# Patient Record
Sex: Male | Born: 1955 | Hispanic: No | Smoking: Never smoker
Health system: Southern US, Community
[De-identification: ages and names within clinical notes are randomized; demographics above are authoritative.]

## PROBLEM LIST (undated history)

## (undated) DIAGNOSIS — E785 Hyperlipidemia, unspecified: Secondary | ICD-10-CM

## (undated) DIAGNOSIS — T7840XA Allergy, unspecified, initial encounter: Secondary | ICD-10-CM

## (undated) HISTORY — PX: KNEE SURGERY: SHX244

## (undated) HISTORY — DX: Allergy, unspecified, initial encounter: T78.40XA

## (undated) HISTORY — DX: Hyperlipidemia, unspecified: E78.5

## (undated) HISTORY — PX: HERNIA REPAIR: SHX51

## (undated) HISTORY — PX: JOINT REPLACEMENT: SHX530

---

## 2008-06-08 ENCOUNTER — Ambulatory Visit: Payer: Self-pay | Admitting: Gastroenterology

## 2008-06-24 ENCOUNTER — Telehealth: Payer: Self-pay | Admitting: Gastroenterology

## 2008-06-30 ENCOUNTER — Ambulatory Visit: Payer: Self-pay | Admitting: Gastroenterology

## 2008-06-30 ENCOUNTER — Encounter: Payer: Self-pay | Admitting: Gastroenterology

## 2008-06-30 HISTORY — PX: COLONOSCOPY: SHX174

## 2008-07-07 ENCOUNTER — Encounter: Payer: Self-pay | Admitting: Gastroenterology

## 2010-10-19 LAB — LIPID PANEL
Cholesterol: 252 mg/dL — AB (ref 0–200)
HDL: 50 mg/dL (ref 35–70)
LDL Cholesterol: 165 mg/dL

## 2011-02-11 ENCOUNTER — Encounter: Payer: Self-pay | Admitting: *Deleted

## 2011-02-11 DIAGNOSIS — E785 Hyperlipidemia, unspecified: Secondary | ICD-10-CM

## 2011-02-13 ENCOUNTER — Ambulatory Visit: Payer: BC Managed Care – PPO

## 2011-02-13 ENCOUNTER — Encounter: Payer: Self-pay | Admitting: Physician Assistant

## 2011-02-13 ENCOUNTER — Ambulatory Visit (INDEPENDENT_AMBULATORY_CARE_PROVIDER_SITE_OTHER): Payer: BC Managed Care – PPO | Admitting: Family Medicine

## 2011-02-13 VITALS — BP 136/78 | HR 76 | Temp 98.0°F | Resp 16 | Ht 66.5 in | Wt 180.0 lb

## 2011-02-13 DIAGNOSIS — R079 Chest pain, unspecified: Secondary | ICD-10-CM

## 2011-02-13 DIAGNOSIS — R0781 Pleurodynia: Secondary | ICD-10-CM

## 2011-02-13 DIAGNOSIS — S20219A Contusion of unspecified front wall of thorax, initial encounter: Secondary | ICD-10-CM

## 2011-02-13 LAB — POCT URINALYSIS DIPSTICK
Bilirubin, UA: NEGATIVE
Glucose, UA: NEGATIVE
Ketones, UA: NEGATIVE
Leukocytes, UA: NEGATIVE
Protein, UA: NEGATIVE
Spec Grav, UA: 1.02

## 2011-02-13 LAB — POCT UA - MICROSCOPIC ONLY
Bacteria, U Microscopic: NEGATIVE
Crystals, Ur, HPF, POC: NEGATIVE
RBC, urine, microscopic: NEGATIVE
WBC, Ur, HPF, POC: NEGATIVE

## 2011-02-13 MED ORDER — CYCLOBENZAPRINE HCL 5 MG PO TABS: 5.0000 mg | ORAL_TABLET | Freq: Two times a day (BID) | ORAL | Status: AC | PRN

## 2011-02-13 MED ORDER — MELOXICAM 7.5 MG PO TABS: 7.5000 mg | ORAL_TABLET | Freq: Two times a day (BID) | ORAL | Status: AC | PRN

## 2011-02-13 NOTE — Progress Notes (Signed)
Patient ID: JANICE SEALES MRN: [id number], DOB: November 19, 1955, 56 y.o. Date of Encounter: 02/13/2011, 2:56 PM  Primary Physician: No primary provider on file.  Chief Complaint: Left flank pain  HPI: 56 year old Caucasian male with history below presents with 18 day history of left sided lower rib pain. Patient states he was sledding in the street 19/20 days prior by running and jumping into the sled chest first possibly causing his left elbow to hit his left side along the lower ribs. He did not notice any pain along this area until 1 or 2 days after sledding. He does not have any pain with inspiration or expiration. Only with pain if he moves a certain by rotation or sometimes from going from supine to seated. Movements do not always cause discomfort. Pain is categorized as dull that sometimes becomes sharp if moves a certain way. Unable to provide further details about movement. Never with bruising. No urinary or GI symptoms. Pain does not radiate into the abdomen. Still able to go to the gym. Tried nothing.  Has not yet gotten Pravastatin filled. Trying lifestyle changes at this time. Has the Rx for Pravastatin at home, considering filling it.     Past Medical History  Diagnosis Date  . Hyperlipidemia      Home Meds: Prior to Admission medications   Medication Sig Start Date End Date Taking? Authorizing Provider  b complex vitamins tablet Take 1 tablet by mouth daily.   Yes Historical Provider, MD  Cholecalciferol (D3 ADULT PO) Take by mouth daily.   Yes Historical Provider, MD  pravastatin (PRAVACHOL) 20 MG tablet Take 20 mg by mouth every evening.    Historical Provider, MD    Allergies: No Known Allergies  History   Social History  . Marital Status: Married    Spouse Name: N/A    Number of Children: N/A  . Years of Education: COLLEGE   Occupational History  . SALES    Social History Main Topics  . Smoking status: Never Smoker   . Smokeless tobacco: Not on file  .  Alcohol Use: Yes     3 PER WEEK  . Drug Use: Not on file  . Sexually Active: Not on file   Other Topics Concern  . Not on file   Social History Narrative   EXERCISE TWICE WEEKLY FOR 1 HOUR.     Review of Systems: Constitutional: negative for chills, fever, night sweats, weight changes, or fatigue  HEENT: negative for vision changes, hearing loss, congestion, rhinorrhea, ST, epistaxis, or sinus pressure Cardiovascular: negative for chest pain or palpitations Respiratory: negative for hemoptysis, wheezing, shortness of breath, or cough Abdominal: negative for abdominal pain, nausea, vomiting, diarrhea, or constipation Dermatological: negative for rash Neurologic: negative for headache, dizziness, or syncope All other systems reviewed and are otherwise negative with the exception to those above and in the HPI.   Physical Exam: Blood pressure 136/78, pulse 76, temperature 98 F (36.7 C), temperature source Oral, resp. rate 16, height 5' 6.5" (1.689 m), weight 180 lb (81.647 kg). Body mass index is 28.62 kg/(m^2). General: Well developed, well nourished, in no acute distress. Head: Normocephalic, atraumatic, eyes without discharge, sclera non-icteric, nares are without discharge. Oral cavity moist.  Neck: Supple. No thyromegaly. Full ROM. No lymphadenopathy. Lungs: Clear bilaterally to auscultation without wheezes, rales, or rhonchi. Breathing is unlabored. No TTP elicited during exam today. Reports his tenderness is located along the midaxillary line along ribs 11 and 12. No wounds or  ecchymosis. Normal breath sounds over this area. Has FROM of left arm without discomfort. Able to go from seated to supine to seated again without difficulty.  Heart: RRR with S1 S2. No murmurs, rubs, or gallops appreciated. Abdomen: Soft, non-tender, non-distended with normoactive bowel sounds. No hepatomegaly. No rebound/guarding. No obvious abdominal masses.  Msk:  Strength and tone normal for  age. Extremities/Skin: Warm and dry. No clubbing or cyanosis. No edema. No rashes or suspicious lesions. Neuro: Alert and oriented X 3. Moves all extremities spontaneously. Gait is normal. CNII-XII grossly in tact. Psych:  Responds to questions appropriately with a normal affect.   Labs: Declines CBC Studies: [facility] reading (PRIMARY) by  Dr. Hal Hope. No fracture. NAD.    ASSESSMENT AND PLAN:  56 y.o. Caucasian male with contusion rib. 1. Mobic 7.5 mg #30 1 po bid prn no rf 2. Flexeril 5 mg #30 1 po bid prn no rf SED 3. Heat pad 4. Declines CBC, stating he has to go home. Advised him of the risks of declining this test. He understands the risks and will RTC on 02/17/11 for CBC and recheck. 5. Await over read 6. RTC precautions 7. D/W Dr. Hal Hope  Signed,  Eula Listen, PA-C 02/13/2011 2:56 PM

## 2011-02-13 NOTE — Patient Instructions (Signed)
Rib Contusion A rib contusion (bruise) can occur by a blow to the chest or by a fall against a hard object. Usually these will be much better in a couple weeks. If X-rays were taken today and there are no broken bones (fractures), the diagnosis of bruising is made. However, broken ribs may not show up for several days, or may be discovered later on a routine X-ray when signs of healing show up. If this happens to you, it does not mean that something was missed on the X-ray, but simply that it did not show up on the first X-rays. Earlier diagnosis will not usually change the treatment. HOME CARE INSTRUCTIONS   Avoid strenuous activity. Be careful during activities and avoid bumping the injured ribs. Activities that pull on the injured ribs and cause pain should be avoided, if possible.   For the first day or two, an ice pack used every 20 minutes while awake may be helpful. Put ice in a plastic bag and put a towel between the bag and the skin.   Eat a normal, well-balanced diet. Drink plenty of fluids to avoid constipation.   Take deep breaths several times a day to keep lungs free of infection. Try to cough several times a day. Splint the injured area with a pillow while coughing to ease pain. Coughing can help prevent pneumonia.   Wear a rib belt or binder only if told to do so by your caregiver. If you are wearing a rib belt or binder, you must do the breathing exercises as directed by your caregiver. If not used properly, rib belts or binders restrict breathing which can lead to pneumonia.   Only take over-the-counter or prescription medicines for pain, discomfort, or fever as directed by your caregiver.  SEEK MEDICAL CARE IF:   You or your child has an oral temperature above 102 F (38.9 C).   Your baby is older than 3 months with a rectal temperature of 100.5 F (38.1 C) or higher for more than 1 day.   You develop a cough, with thick or bloody sputum.  SEEK IMMEDIATE MEDICAL CARE IF:     You have difficulty breathing.   You feel sick to your stomach (nausea), have vomiting or belly (abdominal) pain.   You have worsening pain, not controlled with medications, or there is a change in the location of the pain.   You develop sweating or radiation of the pain into the arms, jaw or shoulders, or become light headed or faint.   You or your child has an oral temperature above 102 F (38.9 C), not controlled by medicine.   Your or your baby is older than 3 months with a rectal temperature of 102 F (38.9 C) or higher.   Your baby is 3 months old or younger with a rectal temperature of 100.4 F (38 C) or higher.  MAKE SURE YOU:   Understand these instructions.   Will watch your condition.   Will get help right away if you are not doing well or get worse.  Document Released: 09/13/2000 Document Revised: 08/31/2010 Document Reviewed: 08/07/2007 [employer] Patient Information 2012 [employer]. 

## 2011-09-19 ENCOUNTER — Ambulatory Visit: Payer: BC Managed Care – PPO

## 2011-09-19 ENCOUNTER — Ambulatory Visit (INDEPENDENT_AMBULATORY_CARE_PROVIDER_SITE_OTHER): Payer: BC Managed Care – PPO | Admitting: Internal Medicine

## 2011-09-19 VITALS — BP 126/74 | HR 74 | Temp 98.5°F | Resp 16 | Ht 68.0 in | Wt 180.0 lb

## 2011-09-19 DIAGNOSIS — R9389 Abnormal findings on diagnostic imaging of other specified body structures: Secondary | ICD-10-CM

## 2011-09-19 DIAGNOSIS — R918 Other nonspecific abnormal finding of lung field: Secondary | ICD-10-CM

## 2011-09-19 DIAGNOSIS — Z0000 Encounter for general adult medical examination without abnormal findings: Secondary | ICD-10-CM

## 2011-09-19 DIAGNOSIS — E789 Disorder of lipoprotein metabolism, unspecified: Secondary | ICD-10-CM

## 2011-09-19 LAB — COMPREHENSIVE METABOLIC PANEL
ALT: 19 U/L (ref 0–53)
BUN: 15 mg/dL (ref 6–23)
CO2: 26 mEq/L (ref 19–32)
Calcium: 9.4 mg/dL (ref 8.4–10.5)
Creat: 1.09 mg/dL (ref 0.50–1.35)
Glucose, Bld: 96 mg/dL (ref 70–99)
Total Bilirubin: 0.7 mg/dL (ref 0.3–1.2)

## 2011-09-19 LAB — POCT CBC
HCT, POC: 49.1 % (ref 43.5–53.7)
Lymph, poc: 2.1 (ref 0.6–3.4)
MCH, POC: 28.1 pg (ref 27–31.2)
MCHC: 31.2 g/dL — AB (ref 31.8–35.4)
POC Granulocyte: 4.1 (ref 2–6.9)
POC LYMPH PERCENT: 31.6 %L (ref 10–50)
POC MID %: 7 %M (ref 0–12)
RDW, POC: 14.2 %

## 2011-09-19 LAB — POCT URINALYSIS DIPSTICK
Blood, UA: NEGATIVE
Ketones, UA: NEGATIVE
Leukocytes, UA: NEGATIVE
Protein, UA: NEGATIVE
pH, UA: 5.5

## 2011-09-19 LAB — LIPID PANEL
Cholesterol: 239 mg/dL — ABNORMAL HIGH (ref 0–200)
HDL: 47 mg/dL (ref >39)
Total CHOL/HDL Ratio: 5.1 Ratio
Triglycerides: 226 mg/dL — ABNORMAL HIGH (ref <150)
VLDL: 45 mg/dL — ABNORMAL HIGH (ref 0–40)

## 2011-09-19 MED ORDER — ATORVASTATIN CALCIUM 10 MG PO TABS: 10.0000 mg | ORAL_TABLET | Freq: Every day | ORAL | Status: DC

## 2011-09-19 NOTE — Progress Notes (Signed)
  Subjective:    Patient ID: Darren Bryant, male    DOB: [birth year], 56 y.o.   MRN: [id number]  HPI Feels good See scanned hx High cholesterol has never started statin Abnormal cxr will repeat.   Review of Systems See nl scanned ros    Objective:   Physical Exam  Constitutional: He is oriented to person, place, and time. He appears well-developed and well-nourished.  HENT:  Right Ear: External ear normal.  Left Ear: External ear normal.  Nose: Nose normal.  Mouth/Throat: Oropharynx is clear and moist.  Eyes: EOM are normal. Pupils are equal, round, and reactive to light. No scleral icterus.  Neck: Normal range of motion. No tracheal deviation present. No thyromegaly present.  Cardiovascular: Normal rate, regular rhythm and normal heart sounds.   Pulmonary/Chest: Effort normal and breath sounds normal.  Abdominal: Soft. Bowel sounds are normal. He exhibits no mass. There is no tenderness.  Genitourinary: Rectum normal, prostate normal and penis normal.  Musculoskeletal: Normal range of motion.  Neurological: He is alert and oriented to person, place, and time. He has normal reflexes. No cranial nerve deficit. Coordination normal.  Skin: Skin is warm.  Psychiatric: He has a normal mood and affect. His behavior is normal. Judgment and thought content normal.   [facility] reading (PRIMARY) by  Dr.Ananias Kolander nl cxr Results for orders placed in visit on 09/19/11  POCT CBC      Component Value Range   WBC 6.7  4.6 - 10.2 K/uL   Lymph, poc 2.1  0.6 - 3.4   POC LYMPH PERCENT 31.6  10 - 50 %L   MID (cbc) 0.5  0 - 0.9   POC MID % 7.0  0 - 12 %M   POC Granulocyte 4.1  2 - 6.9   Granulocyte percent 61.4  37 - 80 %G   RBC 5.45  4.69 - 6.13 M/uL   Hemoglobin 15.3  14.1 - 18.1 g/dL   HCT, POC 62.1  30.8 - 53.7 %   MCV 90.0  80 - 97 fL   MCH, POC 28.1  27 - 31.2 pg   MCHC 31.2 (*) 31.8 - 35.4 g/dL   RDW, POC 65.7     Platelet Count, POC 251  142 - 424 K/uL   MPV 9.8  0 - 99.8 fL  POCT  URINALYSIS DIPSTICK      Component Value Range   Color, UA yellow     Clarity, UA clear     Glucose, UA neg     Bilirubin, UA neg     Ketones, UA neg     Spec Grav, UA 1.015     Blood, UA neg     pH, UA 5.5     Protein, UA neg     Urobilinogen, UA 0.2     Nitrite, UA neg     Leukocytes, UA Negative              Assessment & Plan:  High cholesterol Start lipitor 10mg

## 2011-09-19 NOTE — Patient Instructions (Addendum)
Cholesterol Control Diet  Cholesterol levels in your body are determined significantly by your diet. Cholesterol levels may also be related to heart disease. The following material helps to explain this relationship and discusses what you can do to help keep your heart healthy. Not all cholesterol is bad. Low-density lipoprotein (LDL) cholesterol is the "bad" cholesterol. It may cause fatty deposits to build up inside your arteries. High-density lipoprotein (HDL) cholesterol is "good." It helps to remove the "bad" LDL cholesterol from your blood. Cholesterol is a very important risk factor for heart disease. Other risk factors are high blood pressure, smoking, stress, heredity, and weight.  The heart muscle gets its supply of blood through the coronary arteries. If your LDL cholesterol is high and your HDL cholesterol is low, you are at risk for having fatty deposits build up in your coronary arteries. This leaves less room through which blood can flow. Without sufficient blood and oxygen, the heart muscle cannot function properly and you may feel chest pains (angina pectoris). When a coronary artery closes up entirely, a part of the heart muscle may die, causing a heart attack (myocardial infarction).  CHECKING CHOLESTEROL  When your caregiver sends your blood to a lab to be analyzed for cholesterol, a complete lipid (fat) profile may be done. With this test, the total amount of cholesterol and levels of LDL and HDL are determined. Triglycerides are a type of fat that circulates in the blood and can also be used to determine heart disease risk. The list below describes what the numbers should be:  Test: Total Cholesterol.   Less than 200 mg/dl.   Test: LDL "bad cholesterol."   Less than 100 mg/dl.    Less than 70 mg/dl if you are at very high risk of a heart attack or sudden cardiac death.   Test: HDL "good cholesterol."   Greater than 50 mg/dl for women.    Greater than 40 mg/dl for men.    Test: Triglycerides.   Less than 150 mg/dl.   CONTROLLING CHOLESTEROL WITH DIET  Although exercise and lifestyle factors are important, your diet is key. That is because certain foods are known to raise cholesterol and others to lower it. The goal is to balance foods for their effect on cholesterol and more importantly, to replace saturated and trans fat with other types of fat, such as monounsaturated fat, polyunsaturated fat, and omega-3 fatty acids.  On average, a person should consume no more than 15 to 17 g of saturated fat daily. Saturated and trans fats are considered "bad" fats, and they will raise LDL cholesterol. Saturated fats are primarily found in animal products such as meats, butter, and cream. However, that does not mean you need to sacrifice all your favorite foods. Today, there are good tasting, low-fat, low-cholesterol substitutes for most of the things you like to eat. Choose low-fat or nonfat alternatives. Choose round or loin cuts of red meat, since these types of cuts are lowest in fat and cholesterol. Chicken (without the skin), fish, veal, and ground turkey breast are excellent choices. Eliminate fatty meats, such as hot dogs and salami. Even shellfish have little or no saturated fat. Have a 3 oz (85 g) portion when you eat lean meat, poultry, or fish.  Trans fats are also called "partially hydrogenated oils." They are oils that have been scientifically manipulated so that they are solid at room temperature resulting in a longer shelf life and improved taste and texture of foods in which they are   added. Trans fats are found in stick margarine, some tub margarines, cookies, crackers, and baked goods.    When baking and cooking, oils are an excellent substitute for butter. The monounsaturated oils are especially beneficial since it is believed they lower LDL and raise HDL. The oils you should avoid entirely are saturated tropical oils, such as coconut and palm.     Remember to eat liberally from food groups that are naturally free of saturated and trans fat, including fish, fruit, vegetables, beans, grains (barley, rice, couscous, bulgur wheat), and pasta (without cream sauces).    IDENTIFYING FOODS THAT LOWER CHOLESTEROL    Soluble fiber may lower your cholesterol. This type of fiber is found in fruits such as apples, vegetables such as broccoli, potatoes, and carrots, legumes such as beans, peas, and lentils, and grains such as barley. Foods fortified with plant sterols (phytosterol) may also lower cholesterol. You should eat at least 2 g per day of these foods for a cholesterol lowering effect.    Read package labels to identify low-saturated fats, trans fats free, and low-fat foods at the supermarket. Select cheeses that have only 2 to 3 g saturated fat per ounce. Use a heart-healthy tub margarine that is free of trans fats or partially hydrogenated oil. When buying baked goods (cookies, crackers), avoid partially hydrogenated oils. Breads and muffins should be made from whole grains (whole-wheat or whole oat flour, instead of "flour" or "enriched flour"). Buy non-creamy canned soups with reduced salt and no added fats.    FOOD PREPARATION TECHNIQUES    Never deep-fry. If you must fry, either stir-fry, which uses very little fat, or use non-stick cooking sprays. When possible, broil, bake, or roast meats, and steam vegetables. Instead of dressing vegetables with butter or margarine, use lemon and herbs, applesauce and cinnamon (for squash and sweet potatoes), nonfat yogurt, salsa, and low-fat dressings for salads.    LOW-SATURATED FAT / LOW-FAT FOOD SUBSTITUTES  Meats / Saturated Fat (g)   Avoid: Steak, marbled (3 oz/85 g) / 11 g    Choose: Steak, lean (3 oz/85 g) / 4 g    Avoid: Hamburger (3 oz/85 g) / 7 g    Choose: Hamburger, lean (3 oz/85 g) / 5 g    Avoid: Ham (3 oz/85 g) / 6 g    Choose: Ham, lean cut (3 oz/85 g) / 2.4 g     Avoid: Chicken, with skin, dark meat (3 oz/85 g) / 4 g    Choose: Chicken, skin removed, dark meat (3 oz/85 g) / 2 g    Avoid: Chicken, with skin, light meat (3 oz/85 g) / 2.5 g    Choose: Chicken, skin removed, light meat (3 oz/85 g) / 1 g   Dairy / Saturated Fat (g)   Avoid: Whole milk (1 cup) / 5 g    Choose: Low-fat milk, 2% (1 cup) / 3 g    Choose: Low-fat milk, 1% (1 cup) / 1.5 g    Choose: Skim milk (1 cup) / 0.3 g    Avoid: Hard cheese (1 oz/28 g) / 6 g    Choose: Skim milk cheese (1 oz/28 g) / 2 to 3 g    Avoid: Cottage cheese, 4% fat (1 cup) / 6.5 g    Choose: Low-fat cottage cheese, 1% fat (1 cup) / 1.5 g    Avoid: Ice cream (1 cup) / 9 g    Choose: Sherbet (1 cup) / 2.5 g      Choose: Nonfat frozen yogurt (1 cup) / 0.3 g    Choose: Frozen fruit bar / trace    Avoid: Whipped cream (1 tbs) / 3.5 g    Choose: Nondairy whipped topping (1 tbs) / 1 g   Condiments / Saturated Fat (g)   Avoid: Mayonnaise (1 tbs) / 2 g    Choose: Low-fat mayonnaise (1 tbs) / 1 g    Avoid: Butter (1 tbs) / 7 g    Choose: Extra light margarine (1 tbs) / 1 g    Avoid: Coconut oil (1 tbs) / 11.8 g    Choose: Olive oil (1 tbs) / 1.8 g    Choose: Corn oil (1 tbs) / 1.7 g    Choose: Safflower oil (1 tbs) / 1.2 g    Choose: Sunflower oil (1 tbs) / 1.4 g    Choose: Soybean oil (1 tbs) / 2.4 g    Choose: Canola oil (1 tbs) / 1 g   Document Released: 12/19/2004 Document Revised: 12/08/2010 Document Reviewed: 06/09/2010  [employer] Patient Information 2012 [employer].

## 2011-12-07 ENCOUNTER — Ambulatory Visit (INDEPENDENT_AMBULATORY_CARE_PROVIDER_SITE_OTHER): Payer: BC Managed Care – PPO | Admitting: Family Medicine

## 2011-12-07 VITALS — BP 118/88 | HR 77 | Temp 97.9°F | Resp 16 | Ht 67.0 in | Wt 178.0 lb

## 2011-12-07 DIAGNOSIS — S99929A Unspecified injury of unspecified foot, initial encounter: Secondary | ICD-10-CM

## 2011-12-07 DIAGNOSIS — S838X9A Sprain of other specified parts of unspecified knee, initial encounter: Secondary | ICD-10-CM

## 2011-12-07 DIAGNOSIS — S8990XA Unspecified injury of unspecified lower leg, initial encounter: Secondary | ICD-10-CM

## 2011-12-07 DIAGNOSIS — S86119A Strain of other muscle(s) and tendon(s) of posterior muscle group at lower leg level, unspecified leg, initial encounter: Secondary | ICD-10-CM

## 2011-12-07 DIAGNOSIS — Z23 Encounter for immunization: Secondary | ICD-10-CM

## 2011-12-07 DIAGNOSIS — M79609 Pain in unspecified limb: Secondary | ICD-10-CM

## 2011-12-07 DIAGNOSIS — S86819A Strain of other muscle(s) and tendon(s) at lower leg level, unspecified leg, initial encounter: Secondary | ICD-10-CM

## 2011-12-07 DIAGNOSIS — M79669 Pain in unspecified lower leg: Secondary | ICD-10-CM

## 2011-12-07 NOTE — Patient Instructions (Addendum)
Begin icing and elevating the leg 3 times a day for 15-20 minutes at a time.  Advil or Tylenol for pain if needed.  Begin doing calf exercises daily.  Start by doing them once daily for the next 2-3 days, then advance to doing them 2-3 times per day.  No running or jumping for at least the next 7 days.  If you are not seeing any improvement in the next week, let us know.  We can proceed with doing an ultrasound.  We can also do a course of physical therapy.  Certainly if you feel like things are worsening, call or come back in.

## 2011-12-07 NOTE — Progress Notes (Signed)
Subjective:    Patient ID: Darren Bryant, male    DOB: [birth year], 56 y.o.   MRN: [id number]  HPI  [name] is a 56 yr old male with pain in his right calf after sustaining an injury 12 days ago.  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  Not sure how he injured the leg but suddenly felt pain in the calf and limped off the court.  Was able to continue bearing weight but was limping.  Iced and took some ibuprofen initially.  No swelling or bruising over the calf.  He is seeing some improvement.  Now able to walk ok, only occasionally experiencing pain with some movements.  Dorsiflexing the foot causes pain.  Pain in the central and medial portion of the calf.  No pain in ankle or knee.  No pain in hip, quad, or hamstring.  Has noted some bruising over the medial aspect of the foot/ankle, though no pain or tenderness there.    No history of blood clots, no recent long travel, non-smoker.  No CP, SOB.  Requests flu shot   Review of Systems  Constitutional: Negative for fever and chills.  HENT: Negative.   Respiratory: Negative.   Cardiovascular: Negative.   Gastrointestinal: Negative.   Musculoskeletal: Positive for myalgias (right calf).  Skin: Negative for color change.  Neurological: Negative.        Objective:   Physical Exam  Vitals reviewed. Constitutional: He is oriented to person, place, and time. He appears well-developed and well-nourished. No distress.  HENT:  Head: Normocephalic and atraumatic.  Musculoskeletal:       Right knee: Normal.       Right ankle: Normal.       Right lower leg: He exhibits tenderness. He exhibits no bony tenderness, no swelling, no edema and no deformity.       Left lower leg: Normal.       Legs:      Tenderness over medial portion of the gastroc with deep palpation; achilles tendon intact; neg thompson test; full AROM of foot, ankle, knee; strength, sensation intact  Neurological: He is alert and oriented to  person, place, and time.  Skin: Skin is warm and dry. Ecchymosis (medial aspect right foot/ankle) noted.  Psychiatric: He has a normal mood and affect. His behavior is normal.      Filed Vitals:   12/07/11 0757  BP: 118/88  Pulse: 77  Temp: 97.9 F (36.6 C)  Resp: 16         Assessment & Plan:   1. Gastrocnemius muscle strain    2. Need for influenza vaccination  Flu vaccine greater than or equal to 3yo preservative free IM  3. Calf pain    4. Injury of lower leg       [name] is a 56 yr old male with gastrocnemius strain vs partial tear.  No RF for DVT and has clear MOI.  Given full ROM and strength and ability to bear weight, doubt that there is a complete rupture of the gastroc.  Discussed with pt that we could do a lower leg U/S to completely r/o DVT and visualize any tear or hematoma.  Pt would like to hold off on this.  Will treat with rest, ice, elevation.  Advil or Tylenol for pain, though his pain is much improved.  Printed calf stretches/exercises and gave him theraband to use.  No running or jumping for at least  the next 7 days.  If he is not improving at all in the next week he will RTC.  We can consider U/S and PT evaluation.  Instructed him that complete recovery could take several weeks.  Pt understands and is in agreement with this plan.

## 2012-10-25 ENCOUNTER — Ambulatory Visit (INDEPENDENT_AMBULATORY_CARE_PROVIDER_SITE_OTHER): Payer: BC Managed Care – PPO | Admitting: Family Medicine

## 2012-10-25 ENCOUNTER — Encounter: Payer: Self-pay | Admitting: Family Medicine

## 2012-10-25 VITALS — BP 120/80 | HR 75 | Temp 97.7°F | Resp 16 | Ht 67.0 in | Wt 178.0 lb

## 2012-10-25 DIAGNOSIS — Z23 Encounter for immunization: Secondary | ICD-10-CM

## 2012-10-25 DIAGNOSIS — Z0000 Encounter for general adult medical examination without abnormal findings: Secondary | ICD-10-CM

## 2012-10-25 DIAGNOSIS — E785 Hyperlipidemia, unspecified: Secondary | ICD-10-CM

## 2012-10-25 LAB — COMPREHENSIVE METABOLIC PANEL WITH GFR
ALT: 18 U/L (ref 0–53)
AST: 16 U/L (ref 0–37)
Albumin: 4.5 g/dL (ref 3.5–5.2)
Alkaline Phosphatase: 44 U/L (ref 39–117)
BUN: 17 mg/dL (ref 6–23)
CO2: 27 meq/L (ref 19–32)
Calcium: 9.3 mg/dL (ref 8.4–10.5)
Chloride: 102 meq/L (ref 96–112)
Creat: 1.12 mg/dL (ref 0.50–1.35)
Glucose, Bld: 95 mg/dL (ref 70–99)
Potassium: 4.4 meq/L (ref 3.5–5.3)
Sodium: 138 meq/L (ref 135–145)
Total Bilirubin: 0.6 mg/dL (ref 0.3–1.2)
Total Protein: 7.4 g/dL (ref 6.0–8.3)

## 2012-10-25 LAB — CBC WITH DIFFERENTIAL/PLATELET
Basophils Absolute: 0 10*3/uL (ref 0.0–0.1)
Basophils Relative: 1 % (ref 0–1)
Eosinophils Absolute: 0.2 10*3/uL (ref 0.0–0.7)
Eosinophils Relative: 3 % (ref 0–5)
HCT: 43.8 % (ref 39.0–52.0)
Hemoglobin: 15.5 g/dL (ref 13.0–17.0)
Lymphocytes Relative: 38 % (ref 12–46)
Lymphs Abs: 2.3 10*3/uL (ref 0.7–4.0)
MCH: 29.2 pg (ref 26.0–34.0)
MCHC: 35.4 g/dL (ref 30.0–36.0)
MCV: 82.5 fL (ref 78.0–100.0)
Monocytes Absolute: 0.4 10*3/uL (ref 0.1–1.0)
Monocytes Relative: 7 % (ref 3–12)
Neutro Abs: 3.2 10*3/uL (ref 1.7–7.7)
Neutrophils Relative %: 51 % (ref 43–77)
Platelets: 230 10*3/uL (ref 150–400)
RBC: 5.31 MIL/uL (ref 4.22–5.81)
RDW: 14.4 % (ref 11.5–15.5)
WBC: 6.1 10*3/uL (ref 4.0–10.5)

## 2012-10-25 LAB — LIPID PANEL
Total CHOL/HDL Ratio: 4.8 Ratio
VLDL: 29 mg/dL (ref 0–40)

## 2012-10-25 MED ORDER — MUPIROCIN 2 % EX OINT: TOPICAL_OINTMENT | CUTANEOUS | Status: DC

## 2012-10-25 NOTE — Progress Notes (Signed)
Subjective:    Patient ID: Darren Bryant, male    DOB: 12-14-1955, 57 y.o.   MRN: [id number] Chief Complaint  Patient presents with  . Annual Exam    HPI  Doing well. Never started the lipitor 10 he was prescribed after his physical last yr.  Doesn't like to take pills. Eats a fairly healthy diet - not to much red meat - wife cooks.  Occ gets a sore on the inside of his right nare - will seem to bulge up through his skin and is sore when he presses on the outside of his nose.  Is getting some worse seasonal allergies over the yrs.  Past Medical History  Diagnosis Date  . Hyperlipidemia    Past Surgical History  Procedure Laterality Date  . Hernia repair     Current Outpatient Prescriptions on File Prior to Visit  Medication Sig Dispense Refill  . b complex vitamins tablet Take 1 tablet by mouth daily.      . Cholecalciferol (D3 ADULT PO) Take by mouth daily.       No current facility-administered medications on file prior to visit.   No Known Allergies Family History  Problem Relation Age of Onset  . Heart disease    . Stroke    . Hyperlipidemia    . Heart disease Mother   . Stroke Brother    History   Social History  . Marital Status: Married    Spouse Name: N/A    Number of Children: N/A  . Years of Education: COLLEGE   Occupational History  . SALES    Social History Main Topics  . Smoking status: Never Smoker   . Smokeless tobacco: None  . Alcohol Use: Yes     Comment: 10 drinks per week  . Drug Use: No  . Sexual Activity: Yes   Other Topics Concern  . None   Social History Narrative   EXERCISE TWICE WEEKLY FOR 1 HOUR. Married. Education: [employer].     Review of Systems  Constitutional: Negative.   HENT: Negative.   Eyes: Negative.   Respiratory: Negative.   Cardiovascular: Negative.   Gastrointestinal: Negative.   Endocrine: Negative.   Genitourinary: Negative.   Musculoskeletal: Positive for gait problem.  Skin: Negative.    Allergic/Immunologic: Negative.   Neurological: Negative.   Hematological: Negative.   Psychiatric/Behavioral: Negative.       BP 120/80  Pulse 75  Temp(Src) 97.7 F (36.5 C) (Oral)  Resp 16  Ht 5\' 7"  (1.702 m)  Wt 178 lb (80.74 kg)  BMI 27.87 kg/m2  SpO2 99% Objective:   Physical Exam  Constitutional: He is oriented to person, place, and time. He appears well-developed and well-nourished. No distress.  HENT:  Head: Normocephalic and atraumatic.  Right Ear: External ear and ear canal normal. Tympanic membrane is injected and retracted.  Left Ear: External ear and ear canal normal. Tympanic membrane is injected and retracted.  Nose: Rhinorrhea present.  Mouth/Throat: Uvula is midline and mucous membranes are normal. Posterior oropharyngeal erythema present. No oropharyngeal exudate or posterior oropharyngeal edema.  Large 2+ tonsils at baseline.  Eyes: Conjunctivae are normal. Right eye exhibits no discharge. Left eye exhibits no discharge. No scleral icterus.  Neck: Normal range of motion. Neck supple. No thyromegaly present.  Cardiovascular: Normal rate, regular rhythm, normal heart sounds and intact distal pulses.   Pulmonary/Chest: Effort normal and breath sounds normal. No respiratory distress.  Abdominal: Soft. Bowel sounds are normal. He exhibits no  distension and no mass. There is no tenderness. There is no rebound and no guarding.  Musculoskeletal: He exhibits no edema.  Lymphadenopathy:    He has no cervical adenopathy.  Neurological: He is alert and oriented to person, place, and time. He has normal reflexes. No cranial nerve deficit. He exhibits normal muscle tone.  Skin: Skin is warm and dry. No rash noted. He is not diaphoretic. No erythema.  Psychiatric: He has a normal mood and affect. His behavior is normal.      Assessment & Plan:   Need for prophylactic vaccination and inoculation against influenza - Plan: Flu Vaccine QUAD 36+ mos IM  Routine general  medical examination at a health care facility - Plan: PSA, Lipid panel, Comprehensive metabolic panel, CBC with Differential, TSH  Other and unspecified hyperlipidemia - Plan: Lipid panel - after his chol returns this yr will assess his risk using the ascvd model and then decide whether he wants to start on a statin. Discussed supplements he may be willing to try alternatively.  Meds ordered this encounter  Medications  . DISCONTD: mupirocin ointment (BACTROBAN) 2 %    Sig: Squeeze a small amount up each nare and squeeze nares together to massage or apply with cotton swab bid x 5d.    Dispense:  22 g    Refill:  0  . mupirocin ointment (BACTROBAN) 2 %    Sig: Squeeze a small amount up each nare and squeeze nares together to massage or apply with cotton swab bid x 5d.    Dispense:  22 g    Refill:  0   Norberto Sorenson, MD MPH

## 2012-10-25 NOTE — Patient Instructions (Addendum)
Consider some cholesterol supplements such as fish oil (look for types with particularly high amounts of DHA and EPA), niacin, and flax seed.  CoEnzyme Q10 is though to help you process cholesterol meds more effectively. Red yeast rice supplement traditionally has the same active component as the statin medications but it can be difficult to find a reliable or trustworthy brand that has a good amount the active ingredient in it.  Keeping you healthy  Get these tests  Blood pressure- Have your blood pressure checked once a year by your healthcare provider.  Normal blood pressure is 120/80  Weight- Have your body mass index (BMI) calculated to screen for obesity.  BMI is a measure of body fat based on height and weight. You can also calculate your own BMI at [email or web address].  Cholesterol- Have your cholesterol checked every year.  Diabetes- Have your blood sugar checked regularly if you have high blood pressure, high cholesterol, have a family history of diabetes or if you are overweight.  Screening for Colon Cancer- Colonoscopy starting at age 8.  Screening may begin sooner depending on your family history and other health conditions. Follow up colonoscopy as directed by your Gastroenterologist.  Screening for Prostate Cancer- Both blood work (PSA) and a rectal exam help screen for Prostate Cancer.  Screening begins at age 67 with African-American men and at age 22 with Caucasian men.  Screening may begin sooner depending on your family history.  Take these medicines  Aspirin- One aspirin daily can help prevent Heart disease and Stroke.  Flu shot- Every fall.  Tetanus- Every 10 years.  Zostavax- Once after the age of 30 to prevent Shingles.  Pneumonia shot- Once after the age of 15; if you are younger than 17, ask your healthcare provider if you need a Pneumonia shot.  Take these steps  Don't smoke- If you do smoke, talk to your doctor about quitting.  For tips on how to  quit, go to www.smokefree.gov or call 1-800-QUIT-NOW.  Be physically active- Exercise 5 days a week for at least 30 minutes.  If you are not already physically active start slow and gradually work up to 30 minutes of moderate physical activity.  Examples of moderate activity include walking briskly, mowing the yard, dancing, swimming, bicycling, etc.  Eat a healthy diet- Eat a variety of healthy food such as fruits, vegetables, low fat milk, low fat cheese, yogurt, lean meant, poultry, fish, beans, tofu, etc. For more information go to www.thenutritionsource.org  Drink alcohol in moderation- Limit alcohol intake to less than two drinks a day. Never drink and drive.  Dentist- Brush and floss twice daily; visit your dentist twice a year.  Depression- Your emotional health is as important as your physical health. If you're feeling down, or losing interest in things you would normally enjoy please talk to your healthcare provider.  Eye exam- Visit your eye doctor every year.  Safe sex- If you may be exposed to a sexually transmitted infection, use a condom.  Seat belts- Seat belts can save your life; always wear one.  Smoke/Carbon Monoxide detectors- These detectors need to be installed on the appropriate level of your home.  Replace batteries at least once a year.  Skin cancer- When out in the sun, cover up and use sunscreen 15 SPF or higher.  Violence- If anyone is threatening you, please tell your healthcare provider.  Living Will/ Health care power of attorney- Speak with your healthcare provider and family.

## 2012-10-26 LAB — PSA: PSA: 1.91 ng/mL (ref <=4.00)

## 2012-10-26 LAB — TSH: TSH: 2.562 u[IU]/mL (ref 0.350–4.500)

## 2012-10-30 ENCOUNTER — Encounter: Payer: Self-pay | Admitting: Family Medicine

## 2013-04-10 ENCOUNTER — Encounter: Payer: Self-pay | Admitting: Gastroenterology

## 2013-05-12 ENCOUNTER — Ambulatory Visit (INDEPENDENT_AMBULATORY_CARE_PROVIDER_SITE_OTHER): Payer: Managed Care, Other (non HMO) | Admitting: Family Medicine

## 2013-05-12 VITALS — BP 112/76 | HR 82 | Temp 98.2°F | Resp 16 | Ht 66.5 in | Wt 180.0 lb

## 2013-05-12 DIAGNOSIS — R067 Sneezing: Secondary | ICD-10-CM

## 2013-05-12 DIAGNOSIS — R059 Cough, unspecified: Secondary | ICD-10-CM

## 2013-05-12 DIAGNOSIS — H579 Unspecified disorder of eye and adnexa: Secondary | ICD-10-CM

## 2013-05-12 DIAGNOSIS — R05 Cough: Secondary | ICD-10-CM

## 2013-05-12 DIAGNOSIS — R6889 Other general symptoms and signs: Secondary | ICD-10-CM

## 2013-05-12 LAB — POCT CBC
Granulocyte percent: 59.8 %G (ref 37–80)
HCT, POC: 43.4 % — AB (ref 43.5–53.7)
Hemoglobin: 14.3 g/dL (ref 14.1–18.1)
LYMPH, POC: 2.6 (ref 0.6–3.4)
MCH, POC: 29.4 pg (ref 27–31.2)
MCHC: 32.9 g/dL (ref 31.8–35.4)
MCV: 89.1 fL (ref 80–97)
MID (CBC): 0.7 (ref 0–0.9)
MPV: 9.6 fL (ref 0–99.8)
PLATELET COUNT, POC: 227 10*3/uL (ref 142–424)
POC Granulocyte: 4.9 (ref 2–6.9)
POC LYMPH PERCENT: 32 %L (ref 10–50)
POC MID %: 8.2 % (ref 0–12)
RBC: 4.87 M/uL (ref 4.69–6.13)
RDW, POC: 14.7 %
WBC: 8.2 10*3/uL (ref 4.6–10.2)

## 2013-05-12 MED ORDER — PREDNISONE 20 MG PO TABS: ORAL_TABLET | ORAL | Status: DC

## 2013-05-12 MED ORDER — ALBUTEROL SULFATE HFA 108 (90 BASE) MCG/ACT IN AERS: 2.0000 | INHALATION_SPRAY | Freq: Four times a day (QID) | RESPIRATORY_TRACT | Status: DC | PRN

## 2013-05-12 MED ORDER — FLUTICASONE PROPIONATE 50 MCG/ACT NA SUSP
2.0000 | Freq: Every day | NASAL | Status: DC
Start: 2013-05-12 — End: 2013-07-24

## 2013-05-12 NOTE — Progress Notes (Signed)
[facility] [address] [place] [address] [phone]  Date:  05/12/2013   Name:  Darren Bryant   DOB:  March 09, 1955   MRN:  [id number]  PCP:  Kennon Portela, MD    Chief Complaint: Nasal Congestion and Cough   History of Present Illness:  Darren Bryant is a 58 y.o. very pleasant male patient who presents with the following:  Here today with concern of allergies vs "a cold."    His family recently got a [place] pig- he seems to be allergic to the animal because it made his eyes swell the first time he held it.  His current sx started about 2 weeks ago- around the same time they got their new pet.   He notes itchy eyes- will have to treat them with a cold washcloth.  Yesterday he noted a cough.   He has been ill for about one week now.   He has some sneezing.  Some runny nose, some itchy nose.   No fever, chills or aches.  The cough is generally dry.   He coughed more when he was around the [place] pig last night.   It lives in his daughters bedroom, but they also bring it out in the den some of the time.   He tried some benadryl last night.  He has been using some OTC allergy medication- thinks generic zyrtec.    He is generally in very good health.    Patient Active Problem List   Diagnosis Date Noted  . Hyperlipidemia     Past Medical History  Diagnosis Date  . Hyperlipidemia     Past Surgical History  Procedure Laterality Date  . Hernia repair      History  Substance Use Topics  . Smoking status: Never Smoker   . Smokeless tobacco: Not on file  . Alcohol Use: Yes     Comment: 10 drinks per week    Family History  Problem Relation Age of Onset  . Heart disease    . Stroke    . Hyperlipidemia    . Heart disease Mother   . Stroke Brother     No Known Allergies  Medication list has been reviewed and updated.  Current Outpatient Prescriptions on File Prior to Visit  Medication Sig Dispense Refill  . b complex vitamins  tablet Take 1 tablet by mouth daily.      . Cholecalciferol (D3 ADULT PO) Take by mouth daily.       No current facility-administered medications on file prior to visit.    Review of Systems:  As per HPI- otherwise negative. No eye pain or change in his vision   Physical Examination: Filed Vitals:   05/12/13 1614  BP: 112/76  Pulse: 82  Temp: 98.2 F (36.8 C)  Resp: 16   Filed Vitals:   05/12/13 1614  Height: 5' 6.5" (1.689 m)  Weight: 180 lb (81.647 kg)   Body mass index is 28.62 kg/(m^2). Ideal Body Weight: Weight in (lb) to have BMI = 25: 156.9  GEN: WDWN, NAD, Non-toxic, A & O x 3, looks well, mild overweight HEENT: Atraumatic, Normocephalic. Neck supple. No masses, No LAD.  Bilateral TM wnl, oropharynx normal.  PEERL,EOMI.   Left eye appears slightly irritated.   Ears and Nose: No external deformity. CV: RRR, No M/G/R. No JVD. No thrill. No extra heart sounds. PULM: CTA B, no wheezes, crackles, rhonchi. No retractions. No resp. distress. No  accessory muscle use. ABD: S, NT, ND, +BS. No rebound. No HSM. EXTR: No c/c/e NEURO Normal gait.  PSYCH: Normally interactive. Conversant. Not depressed or anxious appearing.  Calm demeanor.   Results for orders placed in visit on 05/12/13  POCT CBC      Result Value Ref Range   WBC 8.2  4.6 - 10.2 K/uL   Lymph, poc 2.6  0.6 - 3.4   POC LYMPH PERCENT 32.0  10 - 50 %L   MID (cbc) 0.7  0 - 0.9   POC MID % 8.2  0 - 12 %M   POC Granulocyte 4.9  2 - 6.9   Granulocyte percent 59.8  37 - 80 %G   RBC 4.87  4.69 - 6.13 M/uL   Hemoglobin 14.3  14.1 - 18.1 g/dL   HCT, POC 43.4 (*) 43.5 - 53.7 %   MCV 89.1  80 - 97 fL   MCH, POC 29.4  27 - 31.2 pg   MCHC 32.9  31.8 - 35.4 g/dL   RDW, POC 14.7     Platelet Count, POC 227  142 - 424 K/uL   MPV 9.6  0 - 99.8 fL    Assessment and Plan: Cough - Plan: POCT CBC, predniSONE (DELTASONE) 20 MG tablet, albuterol (PROVENTIL HFA;VENTOLIN HFA) 108 (90 BASE) MCG/ACT inhaler  Sneezing -  Plan: fluticasone (FLONASE) 50 MCG/ACT nasal spray  Itchy eyes - Plan: fluticasone (FLONASE) 50 MCG/ACT nasal spray  Suspect allergies to the [place] pig.  Encouraged him to avoid the pet and keep it out of rooms where he spends much time.  Prednisone for 6 days, add flonase and OTC dry eye treatment.  Can use albuterol if sx persist.  He will let me know if not better- Sooner if worse.   See patient instructions for more details.     Signed Lamar Blinks, MD

## 2013-05-12 NOTE — Patient Instructions (Addendum)
Please continue to take your OTC allergy medication daily. Add the flonase nasal spray.   For your eyes purchase some OTC "genteal" gel for dry eyes.   You can also use the prednisone course if needed- this will help to calm down your allergies at least temporarily.  Let me know if you are not better or if you need anything else  If you continue to have cough or wheezing you can also try the albuterol inhaler Let me know if you are not better in the next few days- Sooner if worse.

## 2013-05-29 ENCOUNTER — Ambulatory Visit (INDEPENDENT_AMBULATORY_CARE_PROVIDER_SITE_OTHER): Payer: Managed Care, Other (non HMO) | Admitting: Family Medicine

## 2013-05-29 VITALS — BP 116/68 | HR 81 | Temp 98.2°F | Resp 16 | Ht 67.0 in | Wt 187.0 lb

## 2013-05-29 DIAGNOSIS — R059 Cough, unspecified: Secondary | ICD-10-CM

## 2013-05-29 DIAGNOSIS — R05 Cough: Secondary | ICD-10-CM

## 2013-05-29 MED ORDER — AZITHROMYCIN 250 MG PO TABS
ORAL_TABLET | ORAL | Status: DC
Start: 2013-05-29 — End: 2013-06-06

## 2013-05-29 NOTE — Progress Notes (Signed)
[facility] [address] [place] [address] [phone]  Date:  05/29/2013   Name:  ELO MARMOLEJOS   DOB:  16-Nov-1955   MRN:  [id number]  PCP:  Kennon Portela, MD    Chief Complaint: Cough   History of Present Illness:  ABDULRAHMAN BRACEY is a 58 y.o. very pleasant male patient who presents with the following:  Here today to discuss cough.  He was here about 2 weeks ago with a cough- thought to be due to allergy to new pet, a [place] pig.  He was treated with prednisone, albuterol, flonase.   He feels that his allergies are better, but the cough is still persisting. He has not gotten worse or better, but the cough is just persistent.  He can hear some wheezing some of the time.  He did not end up using the albuterol.   He has been coughing for about 3 weeks total.   The cough is non- productive.  "it's just a dry constant cough."  He will cough more at night some of the time, has had some cough attacks    He is staying away from the [place] pig and has not had any further eye swelling or significant sneezing.    He smokes an occasional cigar but otherwise is a non- smoker He otherwise feels well and is generally healthy  He has used azithromycin in the past with good results   Patient Active Problem List   Diagnosis Date Noted  . Hyperlipidemia     Past Medical History  Diagnosis Date  . Hyperlipidemia     Past Surgical History  Procedure Laterality Date  . Hernia repair      History  Substance Use Topics  . Smoking status: Never Smoker   . Smokeless tobacco: Not on file  . Alcohol Use: Yes     Comment: 10 drinks per week    Family History  Problem Relation Age of Onset  . Heart disease    . Stroke    . Hyperlipidemia    . Heart disease Mother   . Stroke Brother     No Known Allergies  Medication list has been reviewed and updated.  Current Outpatient Prescriptions on File Prior to Visit  Medication Sig Dispense Refill  .  albuterol (PROVENTIL HFA;VENTOLIN HFA) 108 (90 BASE) MCG/ACT inhaler Inhale 2 puffs into the lungs every 6 (six) hours as needed for wheezing or shortness of breath.  1 Inhaler  0  . b complex vitamins tablet Take 1 tablet by mouth daily.      . Cholecalciferol (D3 ADULT PO) Take by mouth daily.      . fluticasone (FLONASE) 50 MCG/ACT nasal spray Place 2 sprays into both nostrils daily.  16 g  6   No current facility-administered medications on file prior to visit.    Review of Systems:  As per HPI- otherwise negative.   Physical Examination: Filed Vitals:   05/29/13 1029  BP: 116/68  Pulse: 81  Temp: 98.2 F (36.8 C)  Resp: 16   Filed Vitals:   05/29/13 1029  Height: 5\' 7"  (1.702 m)  Weight: 187 lb (84.823 kg)   Body mass index is 29.28 kg/(m^2). Ideal Body Weight: Weight in (lb) to have BMI = 25: 159.3  GEN: WDWN, NAD, Non-toxic, A & O x 3, overweight, looks well HEENT: Atraumatic, Normocephalic. Neck supple. No masses, No LAD. Ears and Nose: No external deformity. CV: RRR,  No M/G/R. No JVD. No thrill. No extra heart sounds. PULM: CTA B, no wheezes, crackles, rhonchi. No retractions. No resp. distress. No accessory muscle use.Marland Kitchen EXTR: No c/c/e NEURO Normal gait.  PSYCH: Normally interactive. Conversant. Not depressed or anxious appearing.  Calm demeanor.  Discussed a CXR but he declines at this time  Assessment and Plan: Cough - Plan: azithromycin (ZITHROMAX) 250 MG tablet  Treat for persistent cough with a zpack.  If sx persist he will come in or call.  Declines cough syrup today  Signed Lamar Blinks, MD

## 2013-05-29 NOTE — Patient Instructions (Signed)
Use the azithromycin as directed for your cough. Let me know if you are not better in the next few days- Sooner if worse.

## 2013-06-06 ENCOUNTER — Ambulatory Visit (INDEPENDENT_AMBULATORY_CARE_PROVIDER_SITE_OTHER): Payer: Managed Care, Other (non HMO) | Admitting: Family Medicine

## 2013-06-06 ENCOUNTER — Ambulatory Visit: Payer: Managed Care, Other (non HMO)

## 2013-06-06 VITALS — BP 144/74 | HR 90 | Temp 98.1°F | Resp 18 | Ht 67.0 in | Wt 183.0 lb

## 2013-06-06 DIAGNOSIS — R059 Cough, unspecified: Secondary | ICD-10-CM

## 2013-06-06 DIAGNOSIS — R05 Cough: Secondary | ICD-10-CM

## 2013-06-06 MED ORDER — ALBUTEROL SULFATE (2.5 MG/3ML) 0.083% IN NEBU
2.5000 mg | INHALATION_SOLUTION | Freq: Once | RESPIRATORY_TRACT | Status: AC
Start: 2013-06-06 — End: 2013-06-06
  Administered 2013-06-06: 2.5 mg via RESPIRATORY_TRACT

## 2013-06-06 MED ORDER — MONTELUKAST SODIUM 10 MG PO TABS: 10.0000 mg | ORAL_TABLET | Freq: Every day | ORAL | Status: DC

## 2013-06-06 NOTE — Patient Instructions (Signed)
We are going to add singulair to your regimen.  Continue your [facility] allergy pill, and go back on flonase.  You might also try an OTC acid reducer such as zantac or pepcid.  Please give me a call or email in a week if you are not doing better- Sooner if worse.

## 2013-06-06 NOTE — Progress Notes (Signed)
[facility] [address] [place] [address] [phone]  Date:  06/06/2013   Name:  Darren Bryant   DOB:  Oct 27, 1955   MRN:  [id number]  PCP:  Kennon Portela, MD    Chief Complaint: Cough   History of Present Illness:  Darren Bryant is a 58 y.o. very pleasant male patient who presents with the following:  He was here with a cough about 4 weeks ago.  It was originally thought to be due to a new pet- a [place] pig.  We originally tried some prednisone and flonase, and did a zpack a couple of weeks ago.  He is not have any SOB, but he does have some wheezing.  The cough is still dry. Came back today because the cough just does not seem to be going away  No fever or chills.  Otherwise he is feeling well.   He has never been a smoker.    Wt Readings from Last 3 Encounters:  06/06/13 183 lb (83.008 kg)  05/29/13 187 lb (84.823 kg)  05/12/13 180 lb (81.647 kg)   No night sweats.    Patient Active Problem List   Diagnosis Date Noted  . Hyperlipidemia     Past Medical History  Diagnosis Date  . Hyperlipidemia     Past Surgical History  Procedure Laterality Date  . Hernia repair      History  Substance Use Topics  . Smoking status: Never Smoker   . Smokeless tobacco: Not on file  . Alcohol Use: Yes     Comment: 10 drinks per week    Family History  Problem Relation Age of Onset  . Heart disease    . Stroke    . Hyperlipidemia    . Heart disease Mother   . Stroke Brother     No Known Allergies  Medication list has been reviewed and updated.  Current Outpatient Prescriptions on File Prior to Visit  Medication Sig Dispense Refill  . albuterol (PROVENTIL HFA;VENTOLIN HFA) 108 (90 BASE) MCG/ACT inhaler Inhale 2 puffs into the lungs every 6 (six) hours as needed for wheezing or shortness of breath.  1 Inhaler  0  . azithromycin (ZITHROMAX) 250 MG tablet Use as a zpack  6 tablet  0  . b complex vitamins tablet Take 1 tablet by mouth  daily.      . Cholecalciferol (D3 ADULT PO) Take by mouth daily.      . fluticasone (FLONASE) 50 MCG/ACT nasal spray Place 2 sprays into both nostrils daily.  16 g  6   No current facility-administered medications on file prior to visit.    Review of Systems:  As per HPI- otherwise negative.   Physical Examination: Filed Vitals:   06/06/13 1115  BP: 144/74  Pulse: 90  Temp: 98.1 F (36.7 C)  Resp: 18   Filed Vitals:   06/06/13 1115  Height: 5\' 7"  (1.702 m)  Weight: 183 lb (83.008 kg)   Body mass index is 28.66 kg/(m^2). Ideal Body Weight: Weight in (lb) to have BMI = 25: 159.3  GEN: WDWN, NAD, Non-toxic, A & O x 3, looks well HEENT: Atraumatic, Normocephalic. Neck supple. No masses, No LAD.  Bilateral TM wnl, oropharynx normal.  PEERL,EOMI.   Ears and Nose: No external deformity. CV: RRR, No M/G/R. No JVD. No thrill. No extra heart sounds. PULM: CTA B, no wheezes, crackles, rhonchi. No retractions. No resp. distress. No accessory muscle use. EXTR:  No c/c/e NEURO Normal gait.  PSYCH: Normally interactive. Conversant. Not depressed or anxious appearing.  Calm demeanor.   [facility] reading (PRIMARY) by  Dr. Lorelei Pont. CXR:  Negative for acute illness.   CHEST 2 VIEW  COMPARISON: 09/19/2011.  FINDINGS: The heart size and mediastinal contours are within normal limits. Both lungs are clear. The visualized skeletal structures are unremarkable.  IMPRESSION: No active cardiopulmonary disease. No change from prior normal chest.  Albuterol neb tried. His peak flow was normal prior to neb, about the same after neb  Assessment and Plan: Cough - Plan: DG Chest 2 View, albuterol (PROVENTIL) (2.5 MG/3ML) 0.083% nebulizer solution 2.5 mg, montelukast (SINGULAIR) 10 MG tablet  Persistent cough for about 4 weeks.  This seemed to start after the addition of a [place] pig to his household.  We have tried prednisone and abx so far. Do suspect that this is due to allergies.  Will add  singulair, and he will go back on his flonase.   If the cough continues he will give me a call and I will refer him to pulmonary See patient instructions for more details.     Signed Lamar Blinks, MD

## 2013-06-13 ENCOUNTER — Other Ambulatory Visit: Payer: Self-pay | Admitting: Dermatology

## 2013-07-14 ENCOUNTER — Telehealth: Payer: Self-pay

## 2013-07-14 DIAGNOSIS — R05 Cough: Secondary | ICD-10-CM

## 2013-07-14 DIAGNOSIS — R053 Chronic cough: Secondary | ICD-10-CM

## 2013-07-14 NOTE — Telephone Encounter (Signed)
Called him back- I will be glad to make a referral for him.  His sx are not worse, but he is just still coughing

## 2013-07-14 NOTE — Telephone Encounter (Signed)
Pt called, states has been treated by dr copland his last 2 ov's and she advised if his cough persisted that she could refer him to a pulmonologist. Pt states not better and would like to proceed with that referral Please call pt to advise

## 2013-07-24 ENCOUNTER — Ambulatory Visit (INDEPENDENT_AMBULATORY_CARE_PROVIDER_SITE_OTHER): Payer: Managed Care, Other (non HMO) | Admitting: Internal Medicine

## 2013-07-24 ENCOUNTER — Other Ambulatory Visit (INDEPENDENT_AMBULATORY_CARE_PROVIDER_SITE_OTHER): Payer: Managed Care, Other (non HMO)

## 2013-07-24 ENCOUNTER — Encounter: Payer: Self-pay | Admitting: Internal Medicine

## 2013-07-24 VITALS — BP 132/86 | HR 75 | Temp 98.5°F | Ht 67.0 in | Wt 183.6 lb

## 2013-07-24 DIAGNOSIS — R059 Cough, unspecified: Secondary | ICD-10-CM | POA: Insufficient documentation

## 2013-07-24 DIAGNOSIS — R05 Cough: Secondary | ICD-10-CM

## 2013-07-24 LAB — CBC WITH DIFFERENTIAL/PLATELET
BASOS ABS: 0 10*3/uL (ref 0.0–0.1)
Basophils Relative: 0.6 % (ref 0.0–3.0)
EOS ABS: 0.5 10*3/uL (ref 0.0–0.7)
Eosinophils Relative: 7.6 % — ABNORMAL HIGH (ref 0.0–5.0)
HCT: 43.5 % (ref 39.0–52.0)
Hemoglobin: 15 g/dL (ref 13.0–17.0)
Lymphocytes Relative: 30.4 % (ref 12.0–46.0)
Lymphs Abs: 2.1 10*3/uL (ref 0.7–4.0)
MCHC: 34.5 g/dL (ref 30.0–36.0)
MCV: 85.1 fl (ref 78.0–100.0)
MONO ABS: 0.4 10*3/uL (ref 0.1–1.0)
Monocytes Relative: 6.6 % (ref 3.0–12.0)
NEUTROS ABS: 3.7 10*3/uL (ref 1.4–7.7)
Neutrophils Relative %: 54.8 % (ref 43.0–77.0)
Platelets: 232 10*3/uL (ref 150.0–400.0)
RBC: 5.11 Mil/uL (ref 4.22–5.81)
RDW: 13.6 % (ref 11.5–15.5)
WBC: 6.8 10*3/uL (ref 4.0–10.5)

## 2013-07-24 MED ORDER — PREDNISONE 10 MG PO TABS: ORAL_TABLET | ORAL | Status: DC

## 2013-07-24 NOTE — Patient Instructions (Addendum)
Please remember to go to the lab department downstairs for your tests - we will call you with the results when they are available.  Try prilosec 20mg   Take 30-60 min before first meal of the day and Pepcid 20 mg one bedtime along with chlortrimeton x 2  until cough is completely gone for at least a week without the need for cough suppression  For drainage/tickle  take chlortrimeton (chlorpheniramine) 4 mg every 4 hours available over the counter (may cause drowsiness)   GERD (REFLUX)  is an extremely common cause of respiratory symptoms, many times with no significant heartburn at all.    It can be treated with medication, but also with lifestyle changes including avoidance of late meals, excessive alcohol, smoking cessation, and avoid fatty foods, chocolate, peppermint, colas, red wine, and acidic juices such as orange juice.  NO MINT OR MENTHOL PRODUCTS SO NO COUGH DROPS  USE SUGARLESS CANDY INSTEAD (jolley ranchers or Stover's)  NO OIL BASED VITAMINS - use powdered substitutes.   For cough > delsym 2 tsp every 12 hours   Prednisone 10 mg take  4 each am x 2 days,   2 each am x 2 days,  1 each am x 2 days and stop   Return if not 100% better in 2 weeks.

## 2013-07-24 NOTE — Assessment & Plan Note (Signed)
The most common causes of chronic cough in immunocompetent adults include the following: upper airway cough syndrome (UACS), previously referred to as postnasal drip syndrome (PNDS), which is caused by variety of rhinosinus conditions; (2) asthma; (3) GERD; (4) chronic bronchitis from cigarette smoking or other inhaled environmental irritants; (5) nonasthmatic eosinophilic bronchitis; and (6) bronchiectasis.   These conditions, singly or in combination, have accounted for up to 94% of the causes of chronic cough in prospective studies.   Other conditions have constituted no >6% of the causes in prospective studies These have included bronchogenic carcinoma, chronic interstitial pneumonia, sarcoidosis, left ventricular failure, ACEI-induced cough, and aspiration from a condition associated with pharyngeal dysfunction.    Chronic cough is often simultaneously caused by more than one condition. A single cause has been found from 38 to 82% of the time, multiple causes from 18 to 62%. Multiply caused cough has been the result of three diseases up to 42% of the time.       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   These are the same pts (now being labeled as having "irritable larynx syndrome" by some cough centers) who not infrequently have a history of having failed to tolerate ace inhibitors,  dry powder inhalers or biphosphonates or report having atypical reflux symptoms that don't respond to standard doses of PPI , and  are easily confused as having aecopd or asthma flares by even experienced allergists/ pulmonologists.   The first step is to maximize acid suppression and rx only short course prednisone then regroup if flares after or fails to respond promptly  See instructions for specific recommendations which were reviewed directly with the patient who was given a copy with highlighter outlining the key components.

## 2013-07-24 NOTE — Progress Notes (Signed)
   Subjective:    Patient ID: Darren Bryant, male    DOB: Mar 06, 1955  MRN: [id number]  HPI  63 yowm never smoker with pattern starting around 2009 with head colds to chest with about half the time seen by doctor rx zpak then 2013 noted  Sneezy/ itching and then around 2013 and again 04/2013 exposed [place] pig much worse second exposure eyes watered, itchy, and since then cough so referred to pulmonary clinic 07/24/2013 by Dr Lorelei Pont   07/24/2013 1st [facility] office visit/ Briah Nary  Chief Complaint  Patient presents with  . Pulmonary Consult    Referred per Dr. Janett Billow Copland.  Pt c/o cough for the past 2 1/2 months.  Cough started around the time he brought home a [place] pig. Cough is non prod and occurs with no specific trigger.   works out of home, ? Some better at beach.  singulair no benefit> d/c'd clariton not much benefit. No prednisone yet  Has not tried saba because no trouble breathing Does occ wake at night coughing and in am ? worse  No obvious other patterns in day to day or daytime variabilty or assoc   cp or chest tightness, subjective wheeze overt sinus or hb symptoms. No unusual exp hx or h/o childhood pna/ asthma or knowledge of premature birth.   Also denies any obvious fluctuation of symptoms with weather or environmental changes or other aggravating or alleviating factors except as outlined above   Current Medications, Allergies, Complete Past Medical History, Past Surgical History, Family History, and Social History were reviewed in [employer] record.              Review of Systems  Constitutional: Negative for fever, chills, activity change, appetite change and unexpected weight change.  HENT: Positive for postnasal drip and sneezing. Negative for congestion, dental problem, rhinorrhea, sore throat, trouble swallowing and voice change.   Eyes: Negative for visual disturbance.  Respiratory: Positive for cough. Negative for choking  and shortness of breath.   Cardiovascular: Negative for chest pain and leg swelling.  Gastrointestinal: Negative for nausea, vomiting and abdominal pain.  Genitourinary: Negative for difficulty urinating.  Musculoskeletal: Negative for arthralgias.  Skin: Negative for rash.  Psychiatric/Behavioral: Negative for behavioral problems and confusion.       Objective:   Physical Exam  amb min hoarse wm nad  Wt Readings from Last 3 Encounters:  07/24/13 183 lb 9.6 oz (83.28 kg)  06/06/13 183 lb (83.008 kg)  05/29/13 187 lb (84.823 kg)      HEENT: nl dentition, turbinates, and orophanx. Nl external ear canals without cough reflex   NECK :  without JVD/Nodes/TM/ nl carotid upstrokes bilaterally   LUNGS: no acc muscle use, clear to A and P bilaterally without cough on insp or exp maneuvers   CV:  RRR  no s3 or murmur or increase in P2, no edema   ABD:  soft and nontender with nl excursion in the supine position. No bruits or organomegaly, bowel sounds nl  MS:  warm without deformities, calf tenderness, cyanosis or clubbing  SKIN: warm and dry without lesions    NEURO:  alert, approp, no deficits    Lab Results  Component Value Date   WBC 6.8 07/24/2013   HGB 15.0 07/24/2013   HCT 43.5 07/24/2013   MCV 85.1 07/24/2013   PLT 232.0 07/24/2013     Eos 7.6%      Assessment & Plan:

## 2013-07-25 ENCOUNTER — Encounter: Payer: Self-pay | Admitting: Internal Medicine

## 2013-07-25 LAB — ALLERGY FULL PROFILE
Allergen, D pternoyssinus,d7: <0.1 kU/L
Allergen,Goose feathers, e70: <0.1 kU/L
Aspergillus fumigatus, m3: <0.1 kU/L
Bahia Grass: <0.1 kU/L
CANDIDA ALBICANS: 0.52 kU/L — AB
Cat Dander: <0.1 kU/L
Common Ragweed: <0.1 kU/L
Curvularia lunata: <0.1 kU/L
D. farinae: <0.1 kU/L
Dog Dander: <0.1 kU/L
Elm IgE: <0.1 kU/L
Goldenrod: <0.1 kU/L
Helminthosporium halodes: <0.1 kU/L
House Dust Hollister: <0.1 kU/L
IgE (Immunoglobulin E), Serum: 84.9 IU/mL (ref 0.0–180.0)
Plantain: <0.1 kU/L
Stemphylium Botryosum: <0.1 kU/L
Sycamore Tree: <0.1 kU/L

## 2013-07-25 NOTE — Progress Notes (Signed)
Quick Note:  Spoke with pt and notified of results per Dr. Wert. Pt verbalized understanding and denied any questions.  ______ 

## 2013-08-22 ENCOUNTER — Encounter: Payer: Self-pay | Admitting: Gastroenterology

## 2013-11-03 ENCOUNTER — Telehealth: Payer: Self-pay

## 2013-11-03 NOTE — Telephone Encounter (Signed)
Patient LVMOM at 2:46, requesting the date of his last CPE.  Gave him the information, he is due for another CPE and will be making an appointment soon.

## 2013-11-25 ENCOUNTER — Ambulatory Visit (INDEPENDENT_AMBULATORY_CARE_PROVIDER_SITE_OTHER): Payer: 59 | Admitting: Family Medicine

## 2013-11-25 ENCOUNTER — Encounter: Payer: Self-pay | Admitting: Family Medicine

## 2013-11-25 VITALS — BP 140/84 | HR 70 | Temp 97.8°F | Resp 16 | Ht 67.5 in | Wt 181.4 lb

## 2013-11-25 DIAGNOSIS — E785 Hyperlipidemia, unspecified: Secondary | ICD-10-CM

## 2013-11-25 DIAGNOSIS — Z0000 Encounter for general adult medical examination without abnormal findings: Secondary | ICD-10-CM

## 2013-11-25 DIAGNOSIS — Z125 Encounter for screening for malignant neoplasm of prostate: Secondary | ICD-10-CM

## 2013-11-25 DIAGNOSIS — Z23 Encounter for immunization: Secondary | ICD-10-CM

## 2013-11-25 LAB — CBC WITH DIFFERENTIAL/PLATELET
Basophils Absolute: 0.1 10*3/uL (ref 0.0–0.1)
Basophils Relative: 1 % (ref 0–1)
EOS PCT: 3 % (ref 0–5)
Eosinophils Absolute: 0.2 10*3/uL (ref 0.0–0.7)
HCT: 43.2 % (ref 39.0–52.0)
Hemoglobin: 15 g/dL (ref 13.0–17.0)
Lymphocytes Relative: 36 % (ref 12–46)
Lymphs Abs: 2.4 10*3/uL (ref 0.7–4.0)
MCH: 29.4 pg (ref 26.0–34.0)
MCHC: 34.7 g/dL (ref 30.0–36.0)
MCV: 84.5 fL (ref 78.0–100.0)
MPV: 10.2 fL (ref 9.4–12.4)
Monocytes Absolute: 0.5 10*3/uL (ref 0.1–1.0)
Monocytes Relative: 8 % (ref 3–12)
Neutro Abs: 3.5 10*3/uL (ref 1.7–7.7)
Neutrophils Relative %: 52 % (ref 43–77)
PLATELETS: 250 10*3/uL (ref 150–400)
RBC: 5.11 MIL/uL (ref 4.22–5.81)
RDW: 13.9 % (ref 11.5–15.5)
WBC: 6.8 10*3/uL (ref 4.0–10.5)

## 2013-11-25 LAB — POCT URINALYSIS DIPSTICK
Bilirubin, UA: NEGATIVE
Glucose, UA: NEGATIVE
KETONES UA: NEGATIVE
Leukocytes, UA: NEGATIVE
Nitrite, UA: NEGATIVE
PROTEIN UA: NEGATIVE
RBC UA: NEGATIVE
SPEC GRAV UA: 1.01
Urobilinogen, UA: 0.2
pH, UA: 5.5

## 2013-11-25 LAB — COMPLETE METABOLIC PANEL WITH GFR
ALK PHOS: 44 U/L (ref 39–117)
ALT: 24 U/L (ref 0–53)
AST: 22 U/L (ref 0–37)
Albumin: 4.7 g/dL (ref 3.5–5.2)
BUN: 15 mg/dL (ref 6–23)
CO2: 26 mEq/L (ref 19–32)
Calcium: 9.5 mg/dL (ref 8.4–10.5)
Chloride: 104 mEq/L (ref 96–112)
Creat: 1.11 mg/dL (ref 0.50–1.35)
GFR, Est African American: 84 mL/min
GFR, Est Non African American: 73 mL/min
Glucose, Bld: 100 mg/dL — ABNORMAL HIGH (ref 70–99)
POTASSIUM: 4.6 meq/L (ref 3.5–5.3)
Sodium: 139 mEq/L (ref 135–145)
Total Bilirubin: 0.6 mg/dL (ref 0.2–1.2)
Total Protein: 7.2 g/dL (ref 6.0–8.3)

## 2013-11-25 LAB — LIPID PANEL
CHOLESTEROL: 234 mg/dL — AB (ref 0–200)
HDL: 44 mg/dL (ref >39)
LDL Cholesterol: 153 mg/dL — ABNORMAL HIGH (ref 0–99)
TRIGLYCERIDES: 187 mg/dL — AB (ref <150)
Total CHOL/HDL Ratio: 5.3 Ratio
VLDL: 37 mg/dL (ref 0–40)

## 2013-11-25 LAB — POCT UA - MICROSCOPIC ONLY
CASTS, UR, LPF, POC: NEGATIVE
CRYSTALS, UR, HPF, POC: NEGATIVE
Mucus, UA: POSITIVE
YEAST UA: NEGATIVE

## 2013-11-25 NOTE — Progress Notes (Signed)
Subjective:    Patient ID: Darren Bryant, male    DOB: [birth year], 58 y.o.   MRN: [id number]  HPI This is a 58 yo male who presents for CPE  Last CPE- 2014 Colonoscopy- 06/30/08 PSA- 2014- nml Tdap- 05/13/08 Flu- today  Patient is currently unemployed, seeking employment. His job was eliminated after his company was bought out. He has not been overly stressed. Sleeps well most nights. Is working on projects at home.   Exercise- several times a week. Has noticed weight is about the same, difficult to lose weight.   He has had some left knee pain- sees ortho.  Past Medical History  Diagnosis Date  . Hyperlipidemia    Past Surgical History  Procedure Laterality Date  . Hernia repair     Family History  Problem Relation Age of Onset  . Heart disease    . Stroke    . Hyperlipidemia    . Heart disease Mother   . Stroke Brother    History  Substance Use Topics  . Smoking status: Never Smoker   . Smokeless tobacco: Never Used  . Alcohol Use: Yes     Comment: 10 drinks per week    Review of Systems  Constitutional: Negative.   HENT: Negative.   Respiratory: Negative.   Cardiovascular: Negative.   Gastrointestinal: Negative.   Endocrine: Negative.   Genitourinary: Negative.   Musculoskeletal: Negative.   Skin: Negative.   Allergic/Immunologic: Negative.   Neurological: Negative.   Hematological: Negative.   Psychiatric/Behavioral: Negative.        Objective:   Physical Exam  Constitutional: He is oriented to person, place, and time. He appears well-developed and well-nourished.  HENT:  Head: Normocephalic and atraumatic.  Right Ear: Tympanic membrane, external ear and ear canal normal.  Left Ear: Tympanic membrane, external ear and ear canal normal.  Nose: Nose normal.  Mouth/Throat: Uvula is midline, oropharynx is clear and moist and mucous membranes are normal.  +2 tonsils (patient's baseline- noted previously)   Eyes: Conjunctivae and EOM are normal.  Pupils are equal, round, and reactive to light. Right eye exhibits no discharge. Left eye exhibits no discharge. No scleral icterus.  Neck: Normal range of motion. Neck supple. Carotid bruit is not present.  Cardiovascular: Normal rate, regular rhythm, normal heart sounds and intact distal pulses.   Pulmonary/Chest: Effort normal and breath sounds normal.  Abdominal: Soft. Bowel sounds are normal. He exhibits no distension and no mass. There is no tenderness. There is no rebound and no guarding. Hernia confirmed negative in the right inguinal area and confirmed negative in the left inguinal area.  Genitourinary: Rectum normal, prostate normal, testes normal and penis normal. Circumcised.  Musculoskeletal: Normal range of motion.  Lymphadenopathy:    He has no cervical adenopathy.       Right: No inguinal adenopathy present.       Left: No inguinal adenopathy present.  Neurological: He is alert and oriented to person, place, and time. He has normal reflexes.  Skin: Skin is warm and dry.  Psychiatric: He has a normal mood and affect. His behavior is normal. Judgment and thought content normal.  Vitals reviewed.  BP 140/84 mmHg  Pulse 70  Temp(Src) 97.8 F (36.6 C) (Oral)  Resp 16  Ht 5' 7.5" (1.715 m)  Wt 181 lb 6.4 oz (82.283 kg)  BMI 27.98 kg/m2  SpO2 99%     Assessment & Plan:  1. Annual physical exam  2. Need for prophylactic  vaccination and inoculation against influenza - Flu Vaccine QUAD 36+ mos IM  3. Hyperlipidemia - CBC with Differential - COMPLETE METABOLIC PANEL WITH GFR - Lipid panel  4. Screening for prostate cancer - PSA - POCT urinalysis dipstick - POCT UA - Microscopic Only   Elby Beck, FNP-BC  [facility]  11/25/2013 9:17 AM

## 2013-11-25 NOTE — Patient Instructions (Signed)
Keep up the exercise- try to exercise at least every other day Add stretching/flexibility and balance exercises Keep working on diet- eating healthy, avoiding excessive sweets/simple carbs (breads, pastas, white potatoes).   Why follow it? Research shows. . Those who follow the Mediterranean diet have a reduced risk of heart disease  . The diet is associated with a reduced incidence of Parkinson's and Alzheimer's diseases . People following the diet may have longer life expectancies and lower rates of chronic diseases  . The Dietary Guidelines for Americans recommends the Mediterranean diet as an eating plan to promote health and prevent disease  What Is the Mediterranean Diet?  . Healthy eating plan based on typical foods and recipes of Mediterranean-style cooking . The diet is primarily a plant based diet; these foods should make up a majority of meals   Starches - Plant based foods should make up a majority of meals - They are an important sources of vitamins, minerals, energy, antioxidants, and fiber - Choose whole grains, foods high in fiber and minimally processed items  - Typical grain sources include wheat, oats, barley, corn, brown rice, bulgar, farro, millet, polenta, couscous  - Various types of beans include chickpeas, lentils, fava beans, black beans, white beans   Fruits  Veggies - Large quantities of antioxidant rich fruits & veggies; 6 or more servings  - Vegetables can be eaten raw or lightly drizzled with oil and cooked  - Vegetables common to the traditional Mediterranean Diet include: artichokes, arugula, beets, broccoli, brussel sprouts, cabbage, carrots, celery, collard greens, cucumbers, eggplant, kale, leeks, lemons, lettuce, mushrooms, okra, onions, peas, peppers, potatoes, pumpkin, radishes, rutabaga, shallots, spinach, sweet potatoes, turnips, zucchini - Fruits common to the Mediterranean Diet include: apples, apricots, avocados, cherries, clementines, dates, figs,  grapefruits, grapes, melons, nectarines, oranges, peaches, pears, pomegranates, strawberries, tangerines  Fats - Replace butter and margarine with healthy oils, such as olive oil, canola oil, and tahini  - Limit nuts to no more than a handful a day  - Nuts include walnuts, almonds, pecans, pistachios, pine nuts  - Limit or avoid candied, honey roasted or heavily salted nuts - Olives are central to the [employer] - can be eaten whole or used in a variety of dishes   Meats Protein - Limiting red meat: no more than a few times a month - When eating red meat: choose lean cuts and keep the portion to the size of deck of cards - Eggs: approx. 0 to 4 times a week  - Fish and lean poultry: at least 2 a week  - Healthy protein sources include, chicken, [place], lean beef, lamb - Increase intake of seafood such as tuna, salmon, trout, mackerel, shrimp, scallops - Avoid or limit high fat processed meats such as sausage and bacon  Dairy - Include moderate amounts of low fat dairy products  - Focus on healthy dairy such as fat free yogurt, skim milk, low or reduced fat cheese - Limit dairy products higher in fat such as whole or 2% milk, cheese, ice cream  Alcohol - Moderate amounts of red wine is ok  - No more than 5 oz daily for women (all ages) and men older than age 6  - No more than 10 oz of wine daily for men younger than 58  Other - Limit sweets and other desserts  - Use herbs and spices instead of salt to flavor foods  - Herbs and spices common to the traditional Mediterranean Diet include: basil, bay leaves,  chives, cloves, cumin, fennel, garlic, lavender, marjoram, mint, oregano, parsley, pepper, rosemary, sage, savory, sumac, tarragon, thyme   It's not just a diet, it's a lifestyle:  . The Mediterranean diet includes lifestyle factors typical of those in the region  . Foods, drinks and meals are best eaten with others and savored . Daily physical activity is important for overall good  health . This could be strenuous exercise like running and aerobics . This could also be more leisurely activities such as walking, housework, yard-work, or taking the stairs . Moderation is the key; a balanced and healthy diet accommodates most foods and drinks . Consider portion sizes and frequency of consumption of certain foods   Meal Ideas & Options:  . Breakfast:  o Whole wheat toast or whole wheat English muffins with peanut butter & hard boiled egg o Steel cut oats topped with apples & cinnamon and skim milk  o Fresh fruit: banana, strawberries, melon, berries, peaches  o Smoothies: strawberries, bananas, greek yogurt, peanut butter o Low fat greek yogurt with blueberries and granola  o Egg white omelet with spinach and mushrooms o Breakfast couscous: whole wheat couscous, apricots, skim milk, cranberries  . Sandwiches:  o Hummus and grilled vegetables (peppers, zucchini, squash) on whole wheat bread   o Grilled chicken on whole wheat pita with lettuce, tomatoes, cucumbers or tzatziki  o Tuna salad on whole wheat bread: tuna salad made with greek yogurt, olives, red peppers, capers, green onions o Garlic rosemary lamb pita: lamb sauted with garlic, rosemary, salt & pepper; add lettuce, cucumber, greek yogurt to pita - flavor with lemon juice and black pepper  . Seafood:  o Mediterranean grilled salmon, seasoned with garlic, basil, parsley, lemon juice and black pepper o Shrimp, lemon, and spinach whole-grain pasta salad made with low fat greek yogurt  o Seared scallops with lemon orzo  o Seared tuna steaks seasoned salt, pepper, coriander topped with tomato mixture of olives, tomatoes, olive oil, minced garlic, parsley, green onions and cappers  . Meats:  o Herbed greek chicken salad with kalamata olives, cucumber, feta  o Red bell peppers stuffed with spinach, bulgur, lean ground beef (or lentils) & topped with feta   o Kebabs: skewers of chicken, tomatoes, onions, zucchini,  squash  o [place] burgers: made with red onions, mint, dill, lemon juice, feta cheese topped with roasted red peppers . Vegetarian o Cucumber salad: cucumbers, artichoke hearts, celery, red onion, feta cheese, tossed in olive oil & lemon juice  o Hummus and whole grain pita points with a greek salad (lettuce, tomato, feta, olives, cucumbers, red onion) o Lentil soup with celery, carrots made with vegetable broth, garlic, salt and pepper  o Tabouli salad: parsley, bulgur, mint, scallions, cucumbers, tomato, radishes, lemon juice, olive oil, salt and pepper.

## 2013-11-26 LAB — PSA: PSA: 2.28 ng/mL (ref <=4.00)

## 2013-12-02 ENCOUNTER — Encounter: Payer: Self-pay | Admitting: Family Medicine

## 2013-12-02 ENCOUNTER — Other Ambulatory Visit: Payer: Self-pay | Admitting: Family Medicine

## 2013-12-02 DIAGNOSIS — E785 Hyperlipidemia, unspecified: Secondary | ICD-10-CM

## 2014-01-01 ENCOUNTER — Ambulatory Visit (INDEPENDENT_AMBULATORY_CARE_PROVIDER_SITE_OTHER): Payer: 59 | Admitting: Family Medicine

## 2014-01-01 VITALS — BP 138/72 | HR 76 | Temp 98.2°F | Resp 16 | Ht 67.0 in | Wt 184.6 lb

## 2014-01-01 DIAGNOSIS — M546 Pain in thoracic spine: Secondary | ICD-10-CM

## 2014-01-01 MED ORDER — PREDNISONE 20 MG PO TABS: 40.0000 mg | ORAL_TABLET | Freq: Every day | ORAL | Status: DC

## 2014-01-01 MED ORDER — CYCLOBENZAPRINE HCL 10 MG PO TABS: 10.0000 mg | ORAL_TABLET | Freq: Every day | ORAL | Status: DC

## 2014-01-01 NOTE — Progress Notes (Signed)
This is a 58 yo gentleman who works in [employer].  He was moving bedding up to the attic yesterday.  He developed upper lumbar/lower thoracic back pain in the middle of the night.  He has the pain intermittently and has been taking ibuprofen  No fever, dysuria or bowel problems  Objective:  NAD Back nontender, no scoliosis SLR normal Reflexes in knees and ankles are symmetric No loss of power in legs No numbness in legs  This chart was scribed in my presence and reviewed by me personally.    ICD-9-CM ICD-10-CM   1. Midline thoracic back pain 724.1 M54.6 cyclobenzaprine (FLEXERIL) 10 MG tablet     predniSONE (DELTASONE) 20 MG tablet     Signed, Robyn Haber, MD

## 2014-04-24 ENCOUNTER — Encounter: Payer: Self-pay | Admitting: Gastroenterology

## 2014-09-10 ENCOUNTER — Other Ambulatory Visit: Payer: Self-pay | Admitting: Family Medicine

## 2014-10-01 IMAGING — CR DG CHEST 2V
2 series · 2 of 2 positions shown · non-contrast
Comparison: 09/19/2011.

CLINICAL DATA: Cough.

EXAM:
CHEST  2 VIEW

[PA]
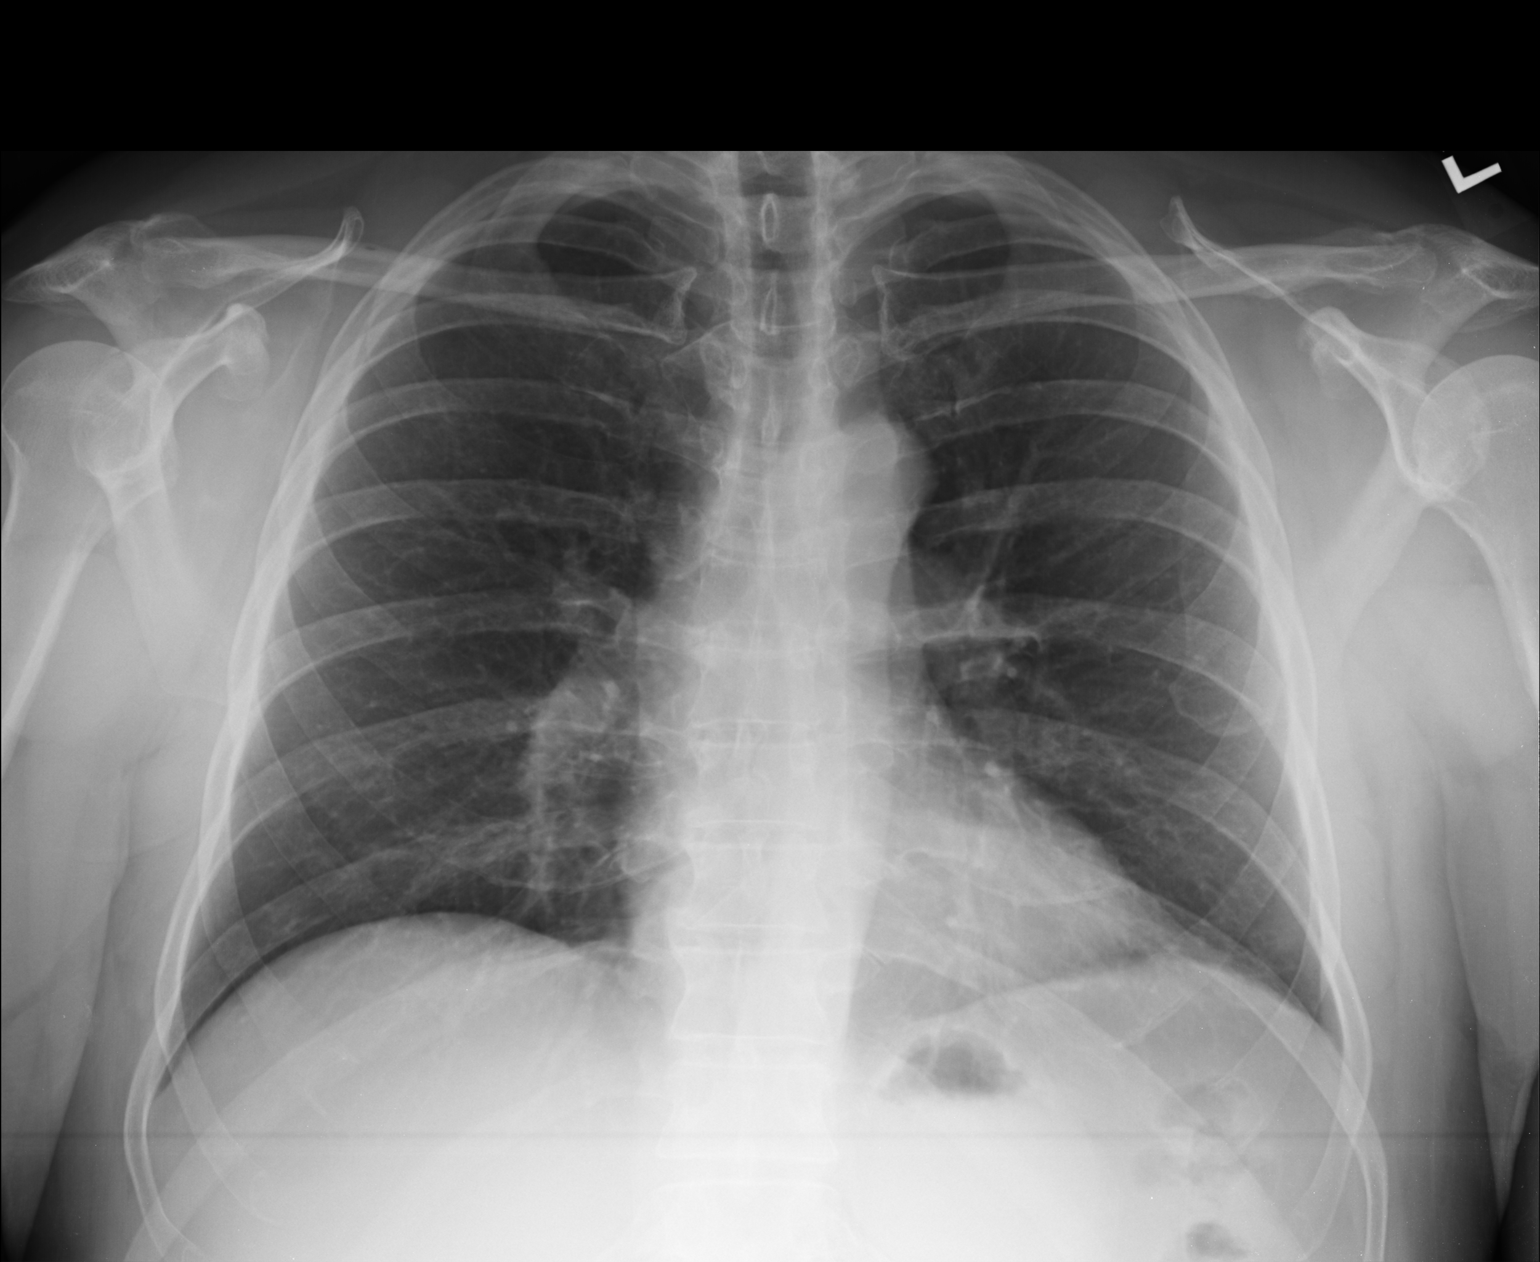

[lateral]
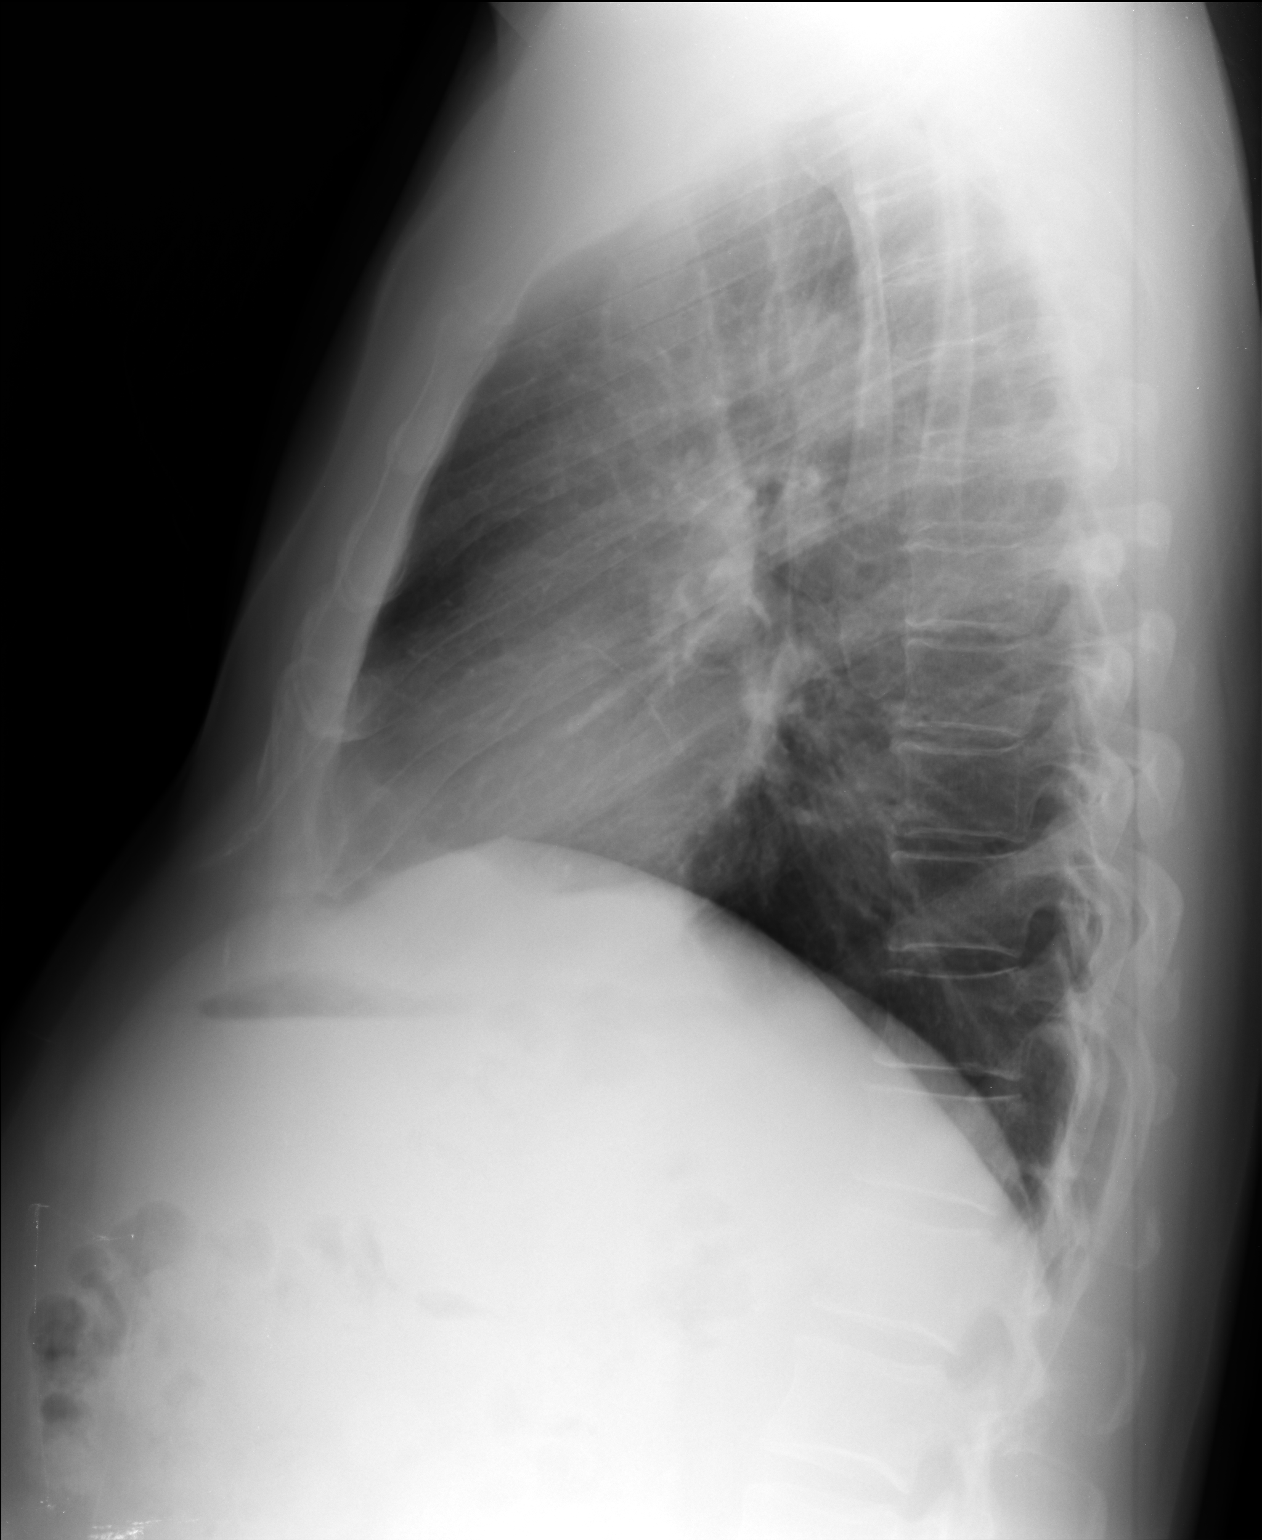

[2 of 2 positions shown; findings below may reference images not displayed]

FINDINGS: The heart size and mediastinal contours are within normal limits.
Both lungs are clear. The visualized skeletal structures are
unremarkable.
IMPRESSION: No active cardiopulmonary disease. No change from prior normal
chest.

## 2014-10-29 ENCOUNTER — Encounter: Payer: Self-pay | Admitting: Urgent Care

## 2014-10-29 ENCOUNTER — Ambulatory Visit (INDEPENDENT_AMBULATORY_CARE_PROVIDER_SITE_OTHER): Payer: 59 | Admitting: Urgent Care

## 2014-10-29 VITALS — BP 133/83 | HR 80 | Temp 98.1°F | Resp 16 | Ht 67.0 in | Wt 181.0 lb

## 2014-10-29 DIAGNOSIS — M25561 Pain in right knee: Secondary | ICD-10-CM | POA: Diagnosis not present

## 2014-10-29 DIAGNOSIS — M7541 Impingement syndrome of right shoulder: Secondary | ICD-10-CM

## 2014-10-29 DIAGNOSIS — R030 Elevated blood-pressure reading, without diagnosis of hypertension: Secondary | ICD-10-CM

## 2014-10-29 DIAGNOSIS — Z23 Encounter for immunization: Secondary | ICD-10-CM

## 2014-10-29 DIAGNOSIS — M25511 Pain in right shoulder: Secondary | ICD-10-CM

## 2014-10-29 MED ORDER — MELOXICAM 15 MG PO TABS: 7.5000 mg | ORAL_TABLET | Freq: Every day | ORAL | Status: DC

## 2014-10-29 MED ORDER — CYCLOBENZAPRINE HCL 10 MG PO TABS: 5.0000 mg | ORAL_TABLET | Freq: Every day | ORAL | Status: DC

## 2014-10-29 NOTE — Patient Instructions (Addendum)
For stretches, perform 10 seconds for 5 sets For exercises, perform 10 repetitions for 5 sets Start these every other day for the first week and then switch to daily as tolerated.   Impingement Syndrome, Rotator Cuff, Bursitis With Rehab Impingement syndrome is a condition that involves inflammation of the tendons of the rotator cuff and the subacromial bursa, that causes pain in the shoulder. The rotator cuff consists of four tendons and muscles that control much of the shoulder and upper arm function. The subacromial bursa is a fluid filled sac that helps reduce friction between the rotator cuff and one of the bones of the shoulder (acromion). Impingement syndrome is usually an overuse injury that causes swelling of the bursa (bursitis), swelling of the tendon (tendonitis), and/or a tear of the tendon (strain). Strains are classified into three categories. Grade 1 strains cause pain, but the tendon is not lengthened. Grade 2 strains include a lengthened ligament, due to the ligament being stretched or partially ruptured. With grade 2 strains there is still function, although the function may be decreased. Grade 3 strains include a complete tear of the tendon or muscle, and function is usually impaired. SYMPTOMS   Pain around the shoulder, often at the outer portion of the upper arm.  Pain that gets worse with shoulder function, especially when reaching overhead or lifting.  Sometimes, aching when not using the arm.  Pain that wakes you up at night.  Sometimes, tenderness, swelling, warmth, or redness over the affected area.  Loss of strength.  Limited motion of the shoulder, especially reaching behind the back (to the back pocket or to unhook bra) or across your body.  Crackling sound (crepitation) when moving the arm.  Biceps tendon pain and inflammation (in the front of the shoulder). Worse when bending the elbow or lifting. CAUSES  Impingement syndrome is often an overuse injury, in  which chronic (repetitive) motions cause the tendons or bursa to become inflamed. A strain occurs when a force is paced on the tendon or muscle that is greater than it can withstand. Common mechanisms of injury include: Stress from sudden increase in duration, frequency, or intensity of training.  Direct hit (trauma) to the shoulder.  Aging, erosion of the tendon with normal use.  Bony bump on shoulder (acromial spur). RISK INCREASES WITH:  Contact sports (football, wrestling, boxing).  Throwing sports (baseball, tennis, volleyball).  Weightlifting and bodybuilding.  Heavy labor.  Previous injury to the rotator cuff, including impingement.  Poor shoulder strength and flexibility.  Failure to warm up properly before activity.  Inadequate protective equipment.  Old age.  Bony bump on shoulder (acromial spur). PREVENTION   Warm up and stretch properly before activity.  Allow for adequate recovery between workouts.  Maintain physical fitness:  Strength, flexibility, and endurance.  Cardiovascular fitness.  Learn and use proper exercise technique. PROGNOSIS  If treated properly, impingement syndrome usually goes away within 6 weeks. Sometimes surgery is required.  RELATED COMPLICATIONS   Longer healing time if not properly treated, or if not given enough time to heal.  Recurring symptoms, that result in a chronic condition.  Shoulder stiffness, frozen shoulder, or loss of motion.  Rotator cuff tendon tear.  Recurring symptoms, especially if activity is resumed too soon, with overuse, with a direct blow, or when using poor technique. TREATMENT  Treatment first involves the use of ice and medicine, to reduce pain and inflammation. The use of strengthening and stretching exercises may help reduce pain with activity. These exercises  may be performed at home or with a therapist. If non-surgical treatment is unsuccessful after more than 6 months, surgery may be advised.  After surgery and rehabilitation, activity is usually possible in 3 months.  MEDICATION  If pain medicine is needed, nonsteroidal anti-inflammatory medicines (aspirin and ibuprofen), or other minor pain relievers (acetaminophen), are often advised.  Do not take pain medicine for 7 days before surgery.  Prescription pain relievers may be given, if your caregiver thinks they are needed. Use only as directed and only as much as you need.  Corticosteroid injections may be given by your caregiver. These injections should be reserved for the most serious cases, because they may only be given a certain number of times. HEAT AND COLD  Cold treatment (icing) should be applied for 10 to 15 minutes every 2 to 3 hours for inflammation and pain, and immediately after activity that aggravates your symptoms. Use ice packs or an ice massage.  Heat treatment may be used before performing stretching and strengthening activities prescribed by your caregiver, physical therapist, or athletic trainer. Use a heat pack or a warm water soak. SEEK MEDICAL CARE IF:   Symptoms get worse or do not improve in 4 to 6 weeks, despite treatment.  New, unexplained symptoms develop. (Drugs used in treatment may produce side effects.) EXERCISES  RANGE OF MOTION (ROM) AND STRETCHING EXERCISES - Impingement Syndrome (Rotator Cuff  Tendinitis, Bursitis) These exercises may help you when beginning to rehabilitate your injury. Your symptoms may go away with or without further involvement from your physician, physical therapist or athletic trainer. While completing these exercises, remember:   Restoring tissue flexibility helps normal motion to return to the joints. This allows healthier, less painful movement and activity.  An effective stretch should be held for at least 30 seconds.  A stretch should never be painful. You should only feel a gentle lengthening or release in the stretched tissue. STRETCH - Flexion,  Standing  Stand with good posture. With an underhand grip on your right / left hand, and an overhand grip on the opposite hand, grasp a broomstick or cane so that your hands are a little more than shoulder width apart.  Keeping your right / left elbow straight and shoulder muscles relaxed, push the stick with your opposite hand, to raise your right / left arm in front of your body and then overhead. Raise your arm until you feel a stretch in your right / left shoulder, but before you have increased shoulder pain.  Try to avoid shrugging your right / left shoulder as your arm rises, by keeping your shoulder blade tucked down and toward your mid-back spine. Hold for __________ seconds.  Slowly return to the starting position. Repeat __________ times. Complete this exercise __________ times per day. STRETCH - Abduction, Supine  Lie on your back. With an underhand grip on your right / left hand and an overhand grip on the opposite hand, grasp a broomstick or cane so that your hands are a little more than shoulder width apart.  Keeping your right / left elbow straight and your shoulder muscles relaxed, push the stick with your opposite hand, to raise your right / left arm out to the side of your body and then overhead. Raise your arm until you feel a stretch in your right / left shoulder, but before you have increased shoulder pain.  Try to avoid shrugging your right / left shoulder as your arm rises, by keeping your shoulder blade tucked down  and toward your mid-back spine. Hold for __________ seconds.  Slowly return to the starting position. Repeat __________ times. Complete this exercise __________ times per day. ROM - Flexion, Active-Assisted  Lie on your back. You may bend your knees for comfort.  Grasp a broomstick or cane so your hands are about shoulder width apart. Your right / left hand should grip the end of the stick, so that your hand is positioned "thumbs-up," as if you were about to  shake hands.  Using your healthy arm to lead, raise your right / left arm overhead, until you feel a gentle stretch in your shoulder. Hold for __________ seconds.  Use the stick to assist in returning your right / left arm to its starting position. Repeat __________ times. Complete this exercise __________ times per day.  ROM - Internal Rotation, Supine   Lie on your back on a firm surface. Place your right / left elbow about 60 degrees away from your side. Elevate your elbow with a folded towel, so that the elbow and shoulder are the same height.  Using a broomstick or cane and your strong arm, pull your right / left hand toward your body until you feel a gentle stretch, but no increase in your shoulder pain. Keep your shoulder and elbow in place throughout the exercise.  Hold for __________ seconds. Slowly return to the starting position. Repeat __________ times. Complete this exercise __________ times per day. STRETCH - Internal Rotation  Place your right / left hand behind your back, palm up.  Throw a towel or belt over your opposite shoulder. Grasp the towel with your right / left hand.  While keeping an upright posture, gently pull up on the towel, until you feel a stretch in the front of your right / left shoulder.  Avoid shrugging your right / left shoulder as your arm rises, by keeping your shoulder blade tucked down and toward your mid-back spine.  Hold for __________ seconds. Release the stretch, by lowering your healthy hand. Repeat __________ times. Complete this exercise __________ times per day. ROM - Internal Rotation   Using an underhand grip, grasp a stick behind your back with both hands.  While standing upright with good posture, slide the stick up your back until you feel a mild stretch in the front of your shoulder.  Hold for __________ seconds. Slowly return to your starting position. Repeat __________ times. Complete this exercise __________ times per day.   STRETCH - Posterior Shoulder Capsule   Stand or sit with good posture. Grasp your right / left elbow and draw it across your chest, keeping it at the same height as your shoulder.  Pull your elbow, so your upper arm comes in closer to your chest. Pull until you feel a gentle stretch in the back of your shoulder.  Hold for __________ seconds. Repeat __________ times. Complete this exercise __________ times per day. STRENGTHENING EXERCISES - Impingement Syndrome (Rotator Cuff Tendinitis, Bursitis) These exercises may help you when beginning to rehabilitate your injury. They may resolve your symptoms with or without further involvement from your physician, physical therapist or athletic trainer. While completing these exercises, remember:  Muscles can gain both the endurance and the strength needed for everyday activities through controlled exercises.  Complete these exercises as instructed by your physician, physical therapist or athletic trainer. Increase the resistance and repetitions only as guided.  You may experience muscle soreness or fatigue, but the pain or discomfort you are trying to eliminate should never  worsen during these exercises. If this pain does get worse, stop and make sure you are following the directions exactly. If the pain is still present after adjustments, discontinue the exercise until you can discuss the trouble with your clinician.  During your recovery, avoid activity or exercises which involve actions that place your injured hand or elbow above your head or behind your back or head. These positions stress the tissues which you are trying to heal. STRENGTH - Scapular Depression and Adduction   With good posture, sit on a firm chair. Support your arms in front of you, with pillows, arm rests, or on a table top. Have your elbows in line with the sides of your body.  Gently draw your shoulder blades down and toward your mid-back spine. Gradually increase the tension,  without tensing the muscles along the top of your shoulders and the back of your neck.  Hold for __________ seconds. Slowly release the tension and relax your muscles completely before starting the next repetition.  After you have practiced this exercise, remove the arm support and complete the exercise in standing as well as sitting position. Repeat __________ times. Complete this exercise __________ times per day.  STRENGTH - Shoulder Abductors, Isometric  With good posture, stand or sit about 4-6 inches from a wall, with your right / left side facing the wall.  Bend your right / left elbow. Gently press your right / left elbow into the wall. Increase the pressure gradually, until you are pressing as hard as you can, without shrugging your shoulder or increasing any shoulder discomfort.  Hold for __________ seconds.  Release the tension slowly. Relax your shoulder muscles completely before you begin the next repetition. Repeat __________ times. Complete this exercise __________ times per day.  STRENGTH - External Rotators, Isometric  Keep your right / left elbow at your side and bend it 90 degrees.  Step into a door frame so that the outside of your right / left wrist can press against the door frame without your upper arm leaving your side.  Gently press your right / left wrist into the door frame, as if you were trying to swing the back of your hand away from your stomach. Gradually increase the tension, until you are pressing as hard as you can, without shrugging your shoulder or increasing any shoulder discomfort.  Hold for __________ seconds.  Release the tension slowly. Relax your shoulder muscles completely before you begin the next repetition. Repeat __________ times. Complete this exercise __________ times per day.  STRENGTH - Supraspinatus   Stand or sit with good posture. Grasp a __________ weight, or an exercise band or tubing, so that your hand is "thumbs-up," like you  are shaking hands.  Slowly lift your right / left arm in a "V" away from your thigh, diagonally into the space between your side and straight ahead. Lift your hand to shoulder height or as far as you can, without increasing any shoulder pain. At first, many people do not lift their hands above shoulder height.  Avoid shrugging your right / left shoulder as your arm rises, by keeping your shoulder blade tucked down and toward your mid-back spine.  Hold for __________ seconds. Control the descent of your hand, as you slowly return to your starting position. Repeat __________ times. Complete this exercise __________ times per day.  STRENGTH - External Rotators  Secure a rubber exercise band or tubing to a fixed object (table, pole) so that it is at the  same height as your right / left elbow when you are standing or sitting on a firm surface.  Stand or sit so that the secured exercise band is at your uninjured side.  Bend your right / left elbow 90 degrees. Place a folded towel or small pillow under your right / left arm, so that your elbow is a few inches away from your side.  Keeping the tension on the exercise band, pull it away from your body, as if pivoting on your elbow. Be sure to keep your body steady, so that the movement is coming only from your rotating shoulder.  Hold for __________ seconds. Release the tension in a controlled manner, as you return to the starting position. Repeat __________ times. Complete this exercise __________ times per day.  STRENGTH - Internal Rotators   Secure a rubber exercise band or tubing to a fixed object (table, pole) so that it is at the same height as your right / left elbow when you are standing or sitting on a firm surface.  Stand or sit so that the secured exercise band is at your right / left side.  Bend your elbow 90 degrees. Place a folded towel or small pillow under your right / left arm so that your elbow is a few inches away from your  side.  Keeping the tension on the exercise band, pull it across your body, toward your stomach. Be sure to keep your body steady, so that the movement is coming only from your rotating shoulder.  Hold for __________ seconds. Release the tension in a controlled manner, as you return to the starting position. Repeat __________ times. Complete this exercise __________ times per day.  STRENGTH - Scapular Protractors, Standing   Stand arms length away from a wall. Place your hands on the wall, keeping your elbows straight.  Begin by dropping your shoulder blades down and toward your mid-back spine.  To strengthen your protractors, keep your shoulder blades down, but slide them forward on your rib cage. It will feel as if you are lifting the back of your rib cage away from the wall. This is a subtle motion and can be challenging to complete. Ask your caregiver for further instruction, if you are not sure you are doing the exercise correctly.  Hold for __________ seconds. Slowly return to the starting position, resting the muscles completely before starting the next repetition. Repeat __________ times. Complete this exercise __________ times per day. STRENGTH - Scapular Protractors, Supine  Lie on your back on a firm surface. Extend your right / left arm straight into the air while holding a __________ weight in your hand.  Keeping your head and back in place, lift your shoulder off the floor.  Hold for __________ seconds. Slowly return to the starting position, and allow your muscles to relax completely before starting the next repetition. Repeat __________ times. Complete this exercise __________ times per day. STRENGTH - Scapular Protractors, Quadruped  Get onto your hands and knees, with your shoulders directly over your hands (or as close as you can be, comfortably).  Keeping your elbows locked, lift the back of your rib cage up into your shoulder blades, so your mid-back rounds out. Keep  your neck muscles relaxed.  Hold this position for __________ seconds. Slowly return to the starting position and allow your muscles to relax completely before starting the next repetition. Repeat __________ times. Complete this exercise __________ times per day.  STRENGTH - Scapular Retractors  Secure a rubber  exercise band or tubing to a fixed object (table, pole), so that it is at the height of your shoulders when you are either standing, or sitting on a firm armless chair.  With a palm down grip, grasp an end of the band in each hand. Straighten your elbows and lift your hands straight in front of you, at shoulder height. Step back, away from the secured end of the band, until it becomes tense.  Squeezing your shoulder blades together, draw your elbows back toward your sides, as you bend them. Keep your upper arms lifted away from your body throughout the exercise.  Hold for __________ seconds. Slowly ease the tension on the band, as you reverse the directions and return to the starting position. Repeat __________ times. Complete this exercise __________ times per day. STRENGTH - Shoulder Extensors   Secure a rubber exercise band or tubing to a fixed object (table, pole) so that it is at the height of your shoulders when you are either standing, or sitting on a firm armless chair.  With a thumbs-up grip, grasp an end of the band in each hand. Straighten your elbows and lift your hands straight in front of you, at shoulder height. Step back, away from the secured end of the band, until it becomes tense.  Squeezing your shoulder blades together, pull your hands down to the sides of your thighs. Do not allow your hands to go behind you.  Hold for __________ seconds. Slowly ease the tension on the band, as you reverse the directions and return to the starting position. Repeat __________ times. Complete this exercise __________ times per day.  STRENGTH - Scapular Retractors and External  Rotators   Secure a rubber exercise band or tubing to a fixed object (table, pole) so that it is at the height as your shoulders, when you are either standing, or sitting on a firm armless chair.  With a palm down grip, grasp an end of the band in each hand. Bend your elbows 90 degrees and lift your elbows to shoulder height, at your sides. Step back, away from the secured end of the band, until it becomes tense.  Squeezing your shoulder blades together, rotate your shoulders so that your upper arms and elbows remain stationary, but your fists travel upward to head height.  Hold for __________ seconds. Slowly ease the tension on the band, as you reverse the directions and return to the starting position. Repeat __________ times. Complete this exercise __________ times per day.  STRENGTH - Scapular Retractors and External Rotators, Rowing   Secure a rubber exercise band or tubing to a fixed object (table, pole) so that it is at the height of your shoulders, when you are either standing, or sitting on a firm armless chair.  With a palm down grip, grasp an end of the band in each hand. Straighten your elbows and lift your hands straight in front of you, at shoulder height. Step back, away from the secured end of the band, until it becomes tense.  Step 1: Squeeze your shoulder blades together. Bending your elbows, draw your hands to your chest, as if you are rowing a boat. At the end of this motion, your hands and elbow should be at shoulder height and your elbows should be out to your sides.  Step 2: Rotate your shoulders, to raise your hands above your head. Your forearms should be vertical and your upper arms should be horizontal.  Hold for __________ seconds. Slowly ease the  tension on the band, as you reverse the directions and return to the starting position. Repeat __________ times. Complete this exercise __________ times per day.  STRENGTH - Scapular Depressors  Find a sturdy chair  without wheels, such as a dining room chair.  Keeping your feet on the floor, and your hands on the chair arms, lift your bottom up from the seat, and lock your elbows.  Keeping your elbows straight, allow gravity to pull your body weight down. Your shoulders will rise toward your ears.  Raise your body against gravity by drawing your shoulder blades down your back, shortening the distance between your shoulders and ears. Although your feet should always maintain contact with the floor, your feet should progressively support less body weight, as you get stronger.  Hold for __________ seconds. In a controlled and slow manner, lower your body weight to begin the next repetition. Repeat __________ times. Complete this exercise __________ times per day.    This information is not intended to replace advice given to you by your health care provider. Make sure you discuss any questions you have with your health care provider.   Document Released: 12/19/2004 Document Revised: 01/09/2014 Document Reviewed: 04/02/2008 [employer] Interactive Patient Education [employer].

## 2014-10-29 NOTE — Progress Notes (Signed)
    MRN: [id number] DOB: 16-May-1955  Subjective:   Darren Bryant is a 59 y.o. male presenting for chief complaint of Shoulder Pain  Reports 2 month history of persistent right shoulder. Pain started after he helped his brother move, was doing heavy lifting which is not particularly used to. He work in [employer], exercises 2-3 times per week. He has had this problem before and resolve with shoulder exercises. Has tried ibuprofen occasionally which does help with both his knee pain and shoulder pain. Denies trauma, neck pain, numbness or tingling, shooting pain, popping sensations, bony deformity. Denies any other aggravating or relieving factors, no other questions or concerns.  Darren Bryant has a current medication list which includes the following prescription(s): b complex vitamins, cholecalciferol, ibuprofen, montelukast, cyclobenzaprine, and prednisone. Also has No Known Allergies.  Darren Bryant  has a past medical history of Hyperlipidemia. Also  has past surgical history that includes Hernia repair.  Objective:   Vitals: BP 142/78 mmHg  Pulse 80  Temp(Src) 98.1 F (36.7 C) (Oral)  Resp 16  Ht 5\' 7"  (1.702 m)  Wt 181 lb (82.101 kg)  BMI 28.34 kg/m2  Physical Exam  Constitutional: He is oriented to person, place, and time. He appears well-developed and well-nourished.  Cardiovascular: Normal rate.   Pulmonary/Chest: Effort normal.  Musculoskeletal:       Right shoulder: He exhibits tenderness (over AC joint). He exhibits normal range of motion, no swelling, no effusion, no crepitus, no deformity, no laceration, no spasm and normal strength.  Positive Hawkins test.  Neurological: He is alert and oriented to person, place, and time.  Skin: Skin is warm and dry. No rash noted. No erythema. No pallor.   Assessment and Plan :   1. Impingement syndrome, shoulder, right 2. Pain in joint of right shoulder - Shoulder rehab reviewed, start meloxicam and flexeril. Recheck in 1-2 weeks, consider  referral to ortho or PT if no improvement.  3. Knee pain, right - stable, continue f/u with ortho. Do not use ibuprofen with meloxicam.  4. Elevated blood pressure reading without diagnosis of hypertension - May be due to his shoulder pain, monitor.  5. Need for prophylactic vaccination and inoculation against influenza - Flu Vaccine QUAD 36+ mos IM   Jaynee Eagles, PA-C [facility] [phone] 10/29/2014 3:56 PM

## 2014-11-12 ENCOUNTER — Ambulatory Visit: Payer: Self-pay | Admitting: Urgent Care

## 2014-12-18 ENCOUNTER — Ambulatory Visit (INDEPENDENT_AMBULATORY_CARE_PROVIDER_SITE_OTHER): Payer: 59 | Admitting: Family Medicine

## 2014-12-18 VITALS — BP 128/80 | HR 86 | Temp 98.0°F | Resp 18 | Ht 67.0 in | Wt 185.0 lb

## 2014-12-18 DIAGNOSIS — Z125 Encounter for screening for malignant neoplasm of prostate: Secondary | ICD-10-CM | POA: Diagnosis not present

## 2014-12-18 DIAGNOSIS — E785 Hyperlipidemia, unspecified: Secondary | ICD-10-CM | POA: Diagnosis not present

## 2014-12-18 DIAGNOSIS — Z0000 Encounter for general adult medical examination without abnormal findings: Secondary | ICD-10-CM

## 2014-12-18 DIAGNOSIS — Z131 Encounter for screening for diabetes mellitus: Secondary | ICD-10-CM | POA: Diagnosis not present

## 2014-12-18 DIAGNOSIS — Z119 Encounter for screening for infectious and parasitic diseases, unspecified: Secondary | ICD-10-CM

## 2014-12-18 DIAGNOSIS — Z130 Encounter for screening for diseases of the blood and blood-forming organs and certain disorders involving the immune mechanism: Secondary | ICD-10-CM | POA: Diagnosis not present

## 2014-12-18 LAB — CBC
HCT: 44.6 % (ref 39.0–52.0)
HEMOGLOBIN: 15 g/dL (ref 13.0–17.0)
MCH: 28.8 pg (ref 26.0–34.0)
MCHC: 33.6 g/dL (ref 30.0–36.0)
MCV: 85.6 fL (ref 78.0–100.0)
MPV: 10.5 fL (ref 8.6–12.4)
PLATELETS: 241 10*3/uL (ref 150–400)
RBC: 5.21 MIL/uL (ref 4.22–5.81)
RDW: 13.9 % (ref 11.5–15.5)
WBC: 7.1 10*3/uL (ref 4.0–10.5)

## 2014-12-18 LAB — LIPID PANEL
CHOL/HDL RATIO: 5 ratio (ref <=5.0)
CHOLESTEROL: 236 mg/dL — AB (ref 125–200)
HDL: 47 mg/dL (ref >=40)
LDL Cholesterol: 160 mg/dL — ABNORMAL HIGH (ref <130)
TRIGLYCERIDES: 147 mg/dL (ref <150)
VLDL: 29 mg/dL (ref <30)

## 2014-12-18 LAB — COMPREHENSIVE METABOLIC PANEL
ALBUMIN: 4.4 g/dL (ref 3.6–5.1)
ALT: 32 U/L (ref 9–46)
AST: 21 U/L (ref 10–35)
Alkaline Phosphatase: 41 U/L (ref 40–115)
BUN: 16 mg/dL (ref 7–25)
CALCIUM: 9.7 mg/dL (ref 8.6–10.3)
CHLORIDE: 102 mmol/L (ref 98–110)
CO2: 27 mmol/L (ref 20–31)
Creat: 1.02 mg/dL (ref 0.70–1.33)
Glucose, Bld: 96 mg/dL (ref 65–99)
POTASSIUM: 4.7 mmol/L (ref 3.5–5.3)
Sodium: 138 mmol/L (ref 135–146)
TOTAL PROTEIN: 7.1 g/dL (ref 6.1–8.1)
Total Bilirubin: 0.3 mg/dL (ref 0.2–1.2)

## 2014-12-18 LAB — HEMOGLOBIN A1C
Hgb A1c MFr Bld: 6.1 % — ABNORMAL HIGH (ref <5.7)
Mean Plasma Glucose: 128 mg/dL — ABNORMAL HIGH (ref <117)

## 2014-12-18 NOTE — Progress Notes (Signed)
[facility] [address], [place] [address] [phone]  Date:  12/18/2014   Name:  Darren Bryant   DOB:  [birth year]   MRN:  [id number]  PCP:  Elby Beck, FNP    Chief Complaint: Annual Exam   History of Present Illness:  Darren Bryant is a 59 y.o. very pleasant male patient who presents with the following:  Here today for a CPE- he started a new job in [employer] and is traveling more He is married. Non -smoker, social drinker.   His recent shoulder issue - he has not been great about working on his exercises   He is fasting today for labs   Patient Active Problem List   Diagnosis Date Noted  . Cough 07/24/2013  . Hyperlipidemia     Past Medical History  Diagnosis Date  . Hyperlipidemia     Past Surgical History  Procedure Laterality Date  . Hernia repair    . Knee surgery Bilateral     Social History  Substance Use Topics  . Smoking status: Never Smoker   . Smokeless tobacco: Never Used  . Alcohol Use: 6.0 oz/week    10 Standard drinks or equivalent per week     Comment: 10 drinks per week    Family History  Problem Relation Age of Onset  . Heart disease    . Stroke    . Hyperlipidemia    . Heart disease Mother   . Stroke Brother     No Known Allergies  Medication list has been reviewed and updated.  Current Outpatient Prescriptions on File Prior to Visit  Medication Sig Dispense Refill  . b complex vitamins tablet Take 1 tablet by mouth daily.    . Cholecalciferol (D3 ADULT PO) Take by mouth daily.    . cyclobenzaprine (FLEXERIL) 10 MG tablet Take 0.5-1 tablets (5-10 mg total) by mouth at bedtime. (Patient not taking: Reported on 12/18/2014) 30 tablet 6  . ibuprofen (ADVIL,MOTRIN) 200 MG tablet Take 200 mg by mouth every 6 (six) hours as needed for mild pain or moderate pain. Reported on 12/18/2014    . meloxicam (MOBIC) 15 MG tablet Take 0.5-1 tablets (7.5-15 mg total) by mouth daily. (Patient not taking:  Reported on 12/18/2014) 30 tablet 6  . montelukast (SINGULAIR) 10 MG tablet Take 1 tablet (10 mg total) by mouth at bedtime. PATIENT NEEDS OFFICE VISIT FOR ADDITIONAL REFILLS (Patient not taking: Reported on 12/18/2014) 30 tablet 0   No current facility-administered medications on file prior to visit.    Review of Systems:  As per HPI- otherwise negative.   Physical Examination: Filed Vitals:   12/18/14 0830  BP: 128/80  Pulse: 86  Temp: 98 F (36.7 C)  Resp: 18   Filed Vitals:   12/18/14 0830  Height: 5\' 7"  (1.702 m)  Weight: 185 lb (83.915 kg)   Body mass index is 28.97 kg/(m^2). Ideal Body Weight: Weight in (lb) to have BMI = 25: 159.3  GEN: WDWN, NAD, Non-toxic, A & O x 3, overweight, looks well HEENT: Atraumatic, Normocephalic. Neck supple. No masses, No LAD. Ears and Nose: No external deformity. CV: RRR, No M/G/R. No JVD. No thrill. No extra heart sounds. PULM: CTA B, no wheezes, crackles, rhonchi. No retractions. No resp. distress. No accessory muscle use. ABD: S, NT, ND. No rebound. No HSM. EXTR: No c/c/e NEURO Normal gait.  PSYCH: Normally interactive. Conversant. Not depressed or anxious appearing.  Calm demeanor.  Gu: normal penis, testes and DTR/ prostate  Assessment and Plan: Physical exam  Screening examination for infectious disease - Plan: Hepatitis C antibody  Screening for deficiency anemia - Plan: CBC  Screening for diabetes mellitus - Plan: Comprehensive metabolic panel, Hemoglobin A1c  Screening for prostate cancer - Plan: PSA  Hyperlipidemia - Plan: Lipid panel  CPE today See patient instructions for more details.   Labs pending Immunizations UTD Encouraged exercise.    Signed Lamar Blinks, MD

## 2014-12-18 NOTE — Patient Instructions (Signed)
It was great to see you today- I will be in touch with your labs asap Please do work on getting regular exercise (at least 30 minutes a day most days of the week) to maintain your health, fitness and weight  Wt Readings from Last 3 Encounters:  12/18/14 185 lb (83.915 kg)  10/29/14 181 lb (82.101 kg)  01/01/14 184 lb 9.6 oz (83.734 kg)   Let me know if you do not seem to adjust to your new schedule and if you continue to feel tired- in that case we may want to do a sleep study for you and make sure that you do not have sleep apnea.    Take care and happy holidays!

## 2014-12-19 ENCOUNTER — Encounter: Payer: Self-pay | Admitting: Family Medicine

## 2014-12-19 DIAGNOSIS — R7303 Prediabetes: Secondary | ICD-10-CM | POA: Insufficient documentation

## 2014-12-19 LAB — PSA: PSA: 2.42 ng/mL (ref <=4.00)

## 2014-12-19 LAB — HEPATITIS C ANTIBODY: HCV Ab: NEGATIVE

## 2015-06-22 ENCOUNTER — Ambulatory Visit (INDEPENDENT_AMBULATORY_CARE_PROVIDER_SITE_OTHER): Payer: BLUE CROSS/BLUE SHIELD | Admitting: Family Medicine

## 2015-06-22 VITALS — BP 140/88 | HR 77 | Temp 97.7°F | Resp 18 | Ht 67.0 in | Wt 185.0 lb

## 2015-06-22 DIAGNOSIS — J029 Acute pharyngitis, unspecified: Secondary | ICD-10-CM

## 2015-06-22 DIAGNOSIS — R05 Cough: Secondary | ICD-10-CM | POA: Diagnosis not present

## 2015-06-22 DIAGNOSIS — R059 Cough, unspecified: Secondary | ICD-10-CM

## 2015-06-22 MED ORDER — AMOXICILLIN 500 MG PO CAPS: 500.0000 mg | ORAL_CAPSULE | Freq: Three times a day (TID) | ORAL | Status: DC

## 2015-06-22 MED ORDER — BENZONATATE 200 MG PO CAPS: 200.0000 mg | ORAL_CAPSULE | Freq: Two times a day (BID) | ORAL | Status: DC | PRN

## 2015-06-22 NOTE — Progress Notes (Signed)
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  He's also developed a cough (nonproductive) and malaise with aches.  Took ibuprofen 1 hour PTA   Objective:  BP 140/88 mmHg  Pulse 77  Temp(Src) 97.7 F (36.5 C) (Oral)  Resp 18  Ht 5\' 7"  (1.702 m)  Wt 185 lb (83.915 kg)  BMI 28.97 kg/m2  SpO2 97% NAD Occasional dry cough Throat red, swollen mildly with no exudate Chest: clear Heart:  Reg, no murmur Skin:  No rash.  Sore throat - Plan: amoxicillin (AMOXIL) 500 MG capsule, Culture, Group A Strep  Cough - Plan: benzonatate (TESSALON) 200 MG capsule  Robyn Haber, MD

## 2015-06-22 NOTE — Patient Instructions (Addendum)
Strep test is pending.  We'll call you with results when they are ready    IF you received an x-ray today, you will receive an invoice from [facility]. Please contact [facility] at [phone] with questions or concerns regarding your invoice.   IF you received labwork today, you will receive an invoice from [employer]. Please contact [facility] at [phone] with questions or concerns regarding your invoice.   Our billing staff will not be able to assist you with questions regarding bills from these companies.  You will be contacted with the lab results as soon as they are available. The fastest way to get your results is to activate your My Chart account. Instructions are located on the last page of this paperwork. If you have not heard from [place] regarding the results in 2 weeks, please contact this office.

## 2015-06-24 LAB — CULTURE, GROUP A STREP: Organism ID, Bacteria: NORMAL

## 2015-12-04 DIAGNOSIS — Z23 Encounter for immunization: Secondary | ICD-10-CM | POA: Diagnosis not present

## 2017-02-15 ENCOUNTER — Encounter: Payer: Self-pay | Admitting: Family Medicine

## 2017-02-15 ENCOUNTER — Ambulatory Visit: Payer: BLUE CROSS/BLUE SHIELD | Admitting: Family Medicine

## 2017-02-15 ENCOUNTER — Other Ambulatory Visit: Payer: Self-pay

## 2017-02-15 VITALS — BP 138/74 | HR 100 | Temp 98.5°F | Ht 67.5 in | Wt 186.0 lb

## 2017-02-15 DIAGNOSIS — J069 Acute upper respiratory infection, unspecified: Secondary | ICD-10-CM

## 2017-02-15 DIAGNOSIS — R062 Wheezing: Secondary | ICD-10-CM | POA: Diagnosis not present

## 2017-02-15 MED ORDER — ALBUTEROL SULFATE HFA 108 (90 BASE) MCG/ACT IN AERS
2.0000 | INHALATION_SPRAY | Freq: Four times a day (QID) | RESPIRATORY_TRACT | 0 refills | Status: DC | PRN
Start: 2017-02-15 — End: 2021-11-08

## 2017-02-15 NOTE — Patient Instructions (Addendum)
  1. Add oral antihistamine such as generic claritin, zyrtec or allegra (this will help with nasal drainage) 2. Start using humidifier at night (this helps with cough and congestion) 3. Continue with mucinex-DM (this helps with cough and congestion)   IF you received an x-ray today, you will receive an invoice from [facility]. Please contact [facility] at [phone] with questions or concerns regarding your invoice.   IF you received labwork today, you will receive an invoice from [facility]. Please contact [facility] at [phone] with questions or concerns regarding your invoice.   Our billing staff will not be able to assist you with questions regarding bills from these companies.  You will be contacted with the lab results as soon as they are available. The fastest way to get your results is to activate your My Chart account. Instructions are located on the last page of this paperwork. If you have not heard from [place] regarding the results in 2 weeks, please contact this office.

## 2017-02-15 NOTE — Progress Notes (Signed)
2/14/20194:41 PM  Darren Bryant September 01, 1955, 62 y.o. male [id number]  Chief Complaint  Patient presents with  . URI    since last Sunday, thinks it may have turned into bronchitis    HPI:   Patient is a 62 y.o. male who presents today for about a week of cough, nasal congestion, headache. Was getting better and then last night cough worsened, dry, not productive. Some wheezing. No fever or chills. No SOB, vomiting or diarrhea. No body aches. Took mucinex-DM, helped. No tobacco use, no asthma.   Depression screen [facility] 2/9 02/15/2017 06/22/2015 12/18/2014  Decreased Interest 0 0 0  Down, Depressed, Hopeless 0 0 0  PHQ - 2 Score 0 0 0    No Known Allergies  Prior to Admission medications   Medication Sig Start Date End Date Taking? Authorizing Provider  b complex vitamins tablet Take 1 tablet by mouth daily.   Yes [provider]  Cholecalciferol (D3 ADULT PO) Take by mouth daily.   Yes [provider]  ibuprofen (ADVIL,MOTRIN) 200 MG tablet Take 200 mg by mouth every 6 (six) hours as needed for mild pain or moderate pain. Reported on 12/18/2014   Yes [provider]    Past Medical History:  Diagnosis Date  . Hyperlipidemia     Past Surgical History:  Procedure Laterality Date  . HERNIA REPAIR    . KNEE SURGERY Bilateral     Social History   Tobacco Use  . Smoking status: Never Smoker  . Smokeless tobacco: Never Used  Substance Use Topics  . Alcohol use: Yes    Alcohol/week: 6.0 oz    Types: 10 Standard drinks or equivalent per week    Comment: 10 drinks per week    Family History  Problem Relation Age of Onset  . Heart disease Mother   . Stroke Brother   . Heart disease Unknown   . Stroke Unknown   . Hyperlipidemia Unknown     ROS Per hpi  OBJECTIVE:  Blood pressure 138/74, pulse 100, temperature 98.5 F (36.9 C), temperature source Oral, height 5' 7.5" (1.715 m), weight 186 lb (84.4 kg), SpO2 97 %.  Physical Exam    Constitutional: He is oriented to person, place, and time and well-developed, well-nourished, and in no distress.  HENT:  Head: Normocephalic and atraumatic.  Right Ear: Hearing, tympanic membrane, external ear and ear canal normal.  Left Ear: Hearing, tympanic membrane, external ear and ear canal normal.  Mouth/Throat: Oropharynx is clear and moist. No oropharyngeal exudate.  Eyes: Conjunctivae and EOM are normal. Pupils are equal, round, and reactive to light.  Neck: Neck supple.  Cardiovascular: Normal rate and regular rhythm. Exam reveals no gallop and no friction rub.  No murmur heard. Pulmonary/Chest: Effort normal. He has wheezes (RLL mild at very end of expiration, cleared with cough). He has no rales.  Lymphadenopathy:    He has no cervical adenopathy.  Neurological: He is alert and oriented to person, place, and time. Gait normal.  Skin: Skin is warm and dry.    ASSESSMENT and PLAN  1. URI with cough and congestion 2. Wheezing Discussed supportive measures for URI: increase hydration, rest, OTC medications, etc. RTC precautions discussed.  Other orders - albuterol (PROVENTIL HFA;VENTOLIN HFA) 108 (90 Base) MCG/ACT inhaler; Inhale 2 puffs into the lungs every 6 (six) hours as needed for wheezing or shortness of breath.  Return if symptoms worsen or fail to improve.    Rutherford Guys, MD  Primary Care at [place], [place] [address] Ph.  [phone] Fax [phone]

## 2017-06-19 DIAGNOSIS — M1712 Unilateral primary osteoarthritis, left knee: Secondary | ICD-10-CM | POA: Insufficient documentation

## 2017-06-22 DIAGNOSIS — M1712 Unilateral primary osteoarthritis, left knee: Secondary | ICD-10-CM | POA: Diagnosis not present

## 2019-01-27 ENCOUNTER — Telehealth: Payer: Self-pay | Admitting: Family Medicine

## 2019-01-27 NOTE — Telephone Encounter (Signed)
Pt was exposed to Darren Bryant. He has been advised to be tested at the [facility] location. In the meantime, pt is seeking clinical advice as to what he can take as a precaution to possibly testing positive. CB number on chart is verified

## 2019-01-28 ENCOUNTER — Ambulatory Visit: Payer: Managed Care, Other (non HMO) | Attending: Internal Medicine

## 2019-01-28 DIAGNOSIS — Z20822 Contact with and (suspected) exposure to covid-19: Secondary | ICD-10-CM

## 2019-01-29 LAB — NOVEL CORONAVIRUS, NAA: SARS-CoV-2, NAA: NOT DETECTED

## 2019-01-30 NOTE — Telephone Encounter (Signed)
Spoke to patient and his covid test was neg and patient was advise nothing needed to be done at this time

## 2019-03-27 ENCOUNTER — Ambulatory Visit: Payer: BC Managed Care – PPO | Attending: Internal Medicine

## 2019-03-27 DIAGNOSIS — Z23 Encounter for immunization: Secondary | ICD-10-CM

## 2019-03-27 NOTE — Progress Notes (Signed)
   Covid-19 Vaccination Clinic  Name:  DARROLD TRIPPLETT    MRN: [id number] DOB: February 14, 1955  03/27/2019  Mr. Demello was observed post Covid-19 immunization for 15 minutes without incident. He was provided with Vaccine Information Sheet and instruction to access the V-Safe system.   Mr. Furnish was instructed to call 911 with any severe reactions post vaccine: Marland Kitchen Difficulty breathing  . Swelling of face and throat  . A fast heartbeat  . A bad rash all over body  . Dizziness and weakness   Immunizations Administered    Name Date Dose VIS Date Route   Pfizer COVID-19 Vaccine 03/27/2019 10:39 AM 0.3 mL 12/13/2018 Intramuscular   Manufacturer: [place]   Lot: [id number]   [place]: [id number]

## 2019-04-21 ENCOUNTER — Ambulatory Visit: Payer: BC Managed Care – PPO | Attending: Internal Medicine

## 2019-04-21 DIAGNOSIS — Z23 Encounter for immunization: Secondary | ICD-10-CM

## 2019-04-21 NOTE — Progress Notes (Signed)
   Covid-19 Vaccination Clinic  Name:  Darren Bryant    MRN: [id number] DOB: [birth year]  04/21/2019  Darren Bryant was observed post Covid-19 immunization for 15 minutes without incident. He was provided with Vaccine Information Sheet and instruction to access the V-Safe system.   Darren Bryant was instructed to call 911 with any severe reactions post vaccine: Marland Kitchen Difficulty breathing  . Swelling of face and throat  . A fast heartbeat  . A bad rash all over body  . Dizziness and weakness   Immunizations Administered    Name Date Dose VIS Date Route   Pfizer COVID-19 Vaccine 04/21/2019 10:06 AM 0.3 mL 02/26/2018 Intramuscular   Manufacturer: [place]   Lot: [id number]   [place]: [id number]

## 2019-08-28 DIAGNOSIS — M1712 Unilateral primary osteoarthritis, left knee: Secondary | ICD-10-CM | POA: Diagnosis not present

## 2019-10-10 DIAGNOSIS — M25662 Stiffness of left knee, not elsewhere classified: Secondary | ICD-10-CM | POA: Diagnosis not present

## 2019-10-10 DIAGNOSIS — M25562 Pain in left knee: Secondary | ICD-10-CM | POA: Diagnosis not present

## 2019-10-10 DIAGNOSIS — M1712 Unilateral primary osteoarthritis, left knee: Secondary | ICD-10-CM | POA: Diagnosis not present

## 2019-10-20 ENCOUNTER — Telehealth: Payer: Self-pay | Admitting: Family Medicine

## 2019-10-20 NOTE — Telephone Encounter (Signed)
He can be worked in to a 64 minute/ CPE slot.

## 2019-10-20 NOTE — Telephone Encounter (Signed)
Pt called to schedule new patient appointment.  He saw you 11/25/13.  He is having knee replacement surgery 11/16 with dr frank alusio and needs clearance Can pt be worked in to be reestablish and surgical clearance  Please advise

## 2019-10-21 NOTE — Telephone Encounter (Signed)
11/1 appointment pt aware

## 2019-11-03 ENCOUNTER — Ambulatory Visit: Payer: BC Managed Care – PPO | Admitting: Family Medicine

## 2019-11-03 ENCOUNTER — Other Ambulatory Visit: Payer: Self-pay

## 2019-11-03 ENCOUNTER — Encounter: Payer: Self-pay | Admitting: Family Medicine

## 2019-11-03 VITALS — BP 148/80 | HR 74 | Temp 98.0°F | Ht 66.75 in | Wt 184.0 lb

## 2019-11-03 DIAGNOSIS — Z23 Encounter for immunization: Secondary | ICD-10-CM | POA: Diagnosis not present

## 2019-11-03 DIAGNOSIS — Z114 Encounter for screening for human immunodeficiency virus [HIV]: Secondary | ICD-10-CM

## 2019-11-03 DIAGNOSIS — Z0000 Encounter for general adult medical examination without abnormal findings: Secondary | ICD-10-CM | POA: Diagnosis not present

## 2019-11-03 DIAGNOSIS — B078 Other viral warts: Secondary | ICD-10-CM | POA: Diagnosis not present

## 2019-11-03 DIAGNOSIS — E785 Hyperlipidemia, unspecified: Secondary | ICD-10-CM | POA: Diagnosis not present

## 2019-11-03 DIAGNOSIS — Z125 Encounter for screening for malignant neoplasm of prostate: Secondary | ICD-10-CM

## 2019-11-03 DIAGNOSIS — R7303 Prediabetes: Secondary | ICD-10-CM

## 2019-11-03 DIAGNOSIS — E559 Vitamin D deficiency, unspecified: Secondary | ICD-10-CM

## 2019-11-03 DIAGNOSIS — L570 Actinic keratosis: Secondary | ICD-10-CM | POA: Diagnosis not present

## 2019-11-03 DIAGNOSIS — Z01818 Encounter for other preprocedural examination: Secondary | ICD-10-CM

## 2019-11-03 DIAGNOSIS — Z1211 Encounter for screening for malignant neoplasm of colon: Secondary | ICD-10-CM

## 2019-11-03 NOTE — Progress Notes (Signed)
Subjective:    Patient ID: Darren Bryant, male    DOB: 08/15/1955, 64 y.o.   MRN: [id number]  HPI Chief Complaint  Patient presents with  . New Patient (Initial Visit)    pt request cpe, having surgery 11/18/2019 left knee replacement   This is a 64 yo male who presents today to re establish care. Last seen by me 11/25/2013. He has been doing ok, works in Journalist, newspaper, travels for work. Has two children, 58 (daughter at [facility]), 76 (son, Geophysicist/field seismologist).   Last CPE- 12/18/2014, has not had any recent primary care PSA- today Colonoscopy- over due, does not recall getting recall letter Tdap- 05/13/2008 Flu- some years, will have today Dental- regular Eye-regular Exercise-limited by knee pain  Preoperative physical needed- having left knee replaced later this month by Dr. Maureen Ralphs.   Wart- left middle finger, many years, has not tried anything recently  Actinic keratosis- has not seen derm in many years.    Review of Systems  Constitutional: Negative.   HENT: Negative.   Eyes: Negative.   Respiratory: Negative.   Cardiovascular: Negative.   Gastrointestinal: Negative.   Endocrine: Negative.   Genitourinary: Negative.   Musculoskeletal:       Left knee pain  Skin: Positive for color change (redness, rough skin around face).       Lesion left middle finger.   Allergic/Immunologic: Negative.   Neurological: Negative.   Hematological: Negative.   Psychiatric/Behavioral: Negative.        Objective:   Physical Exam Physical Exam  Constitutional: He is oriented to person, place, and time. He appears well-developed and well-nourished.  HENT:  Head: Normocephalic and atraumatic.  Right Ear: External ear normal. TM normal.  Left Ear: External ear normal.  TM normal.  Nose: Nose normal.  Mouth/Throat: Oropharynx is clear and moist.  Eyes: Conjunctivae are normal.  Neck: Normal range of motion. Neck supple.  Cardiovascular: Normal rate, regular rhythm, normal  heart sounds and intact distal pulses.   Pulmonary/Chest: Effort normal and breath sounds normal.  Abdominal: Soft. Bowel sounds are normal. Hernia confirmed negative in the right inguinal area and confirmed negative in the left inguinal area.  Genitourinary: Testes normal and penis normal. Circumcised.  Musculoskeletal: Normal range of motion. He exhibits no edema or tenderness.       Cervical back: Normal.       Thoracic back: Normal.       Lumbar back: Normal.  Lymphadenopathy:    He has no cervical adenopathy.       Right: No inguinal adenopathy present.       Left: No inguinal adenopathy present.  Neurological: He is alert and oriented to person, place, and time.  Skin: Skin is warm and dry.  Psychiatric: He has a normal mood and affect. His behavior is normal. Judgment normal.  Vitals reviewed.    BP (!) 148/80   Pulse 74   Temp 98 F (36.7 C) (Temporal)   Ht 5' 6.75" (1.695 m)   Wt 184 lb (83.5 kg)   SpO2 97%   BMI 29.03 kg/m  Wt Readings from Last 3 Encounters:  11/03/19 184 lb (83.5 kg)  02/15/17 186 lb (84.4 kg)  06/22/15 185 lb (83.9 kg)   BP Readings from Last 3 Encounters:  11/03/19 (!) 148/80  02/15/17 138/74  06/22/15 140/88   EKG- Normal Sinus Rhythm, rate 69, PR 184, QTc 416. Compared to 09/22/2011 tracing.   Verbal consent obtained.  Cleansed skin with  alcohol prep pad.  Cryotherapy applied (freeze-thaw x 3).  The patient tolerated the procedure well.     Assessment & Plan:  1. Preoperative general physical examination - Normal EKG, will await labs, history and physical exam with no contraindications to surgery - Protime-INR - EKG 12-Lead  2. Hyperlipidemia, unspecified hyperlipidemia type - CBC with Differential - Comprehensive metabolic panel - Hemoglobin A1c - Lipid Panel  3. Pre-diabetes - CBC with Differential - Comprehensive metabolic panel - Hemoglobin A1c - Lipid Panel  4. Screening for prostate cancer - PSA  5. Screening for  HIV without presence of risk factors - HIV Antibody (routine testing w rflx)  6. Need for influenza vaccination - Flu Vaccine QUAD 6+ mos PF IM (Fluarix Quad PF)  7. Vitamin D deficiency - Vitamin D, 25-hydroxy  8. Common wart - treated with liquid nitrogen per above, discussed further otc measures while awaiting dermatology appintment - Ambulatory referral to Dermatology  9. Actinic keratosis - Ambulatory referral to Dermatology  10. Screening for colon cancer - overdue, provided him with copy of GI recall letter, encouraged him to call for appointment  This visit occurred during the SARS-CoV-2 public health emergency.  Safety protocols were in place, including screening questions prior to the visit, additional usage of staff PPE, and extensive cleaning of exam room while observing appropriate contact time as indicated for disinfecting solutions.    Clarene Reamer, FNP-BC  [place] [facility], [facility]  11/04/2019 7:41 AM

## 2019-11-03 NOTE — Patient Instructions (Signed)
Good to see you today  I will notify you of labs via Mychart   Warts  Warts are small growths on the skin. They are common, and they are caused by a virus. Warts can be found on many parts of the body. A person may have one wart or many warts. Most warts will go away on their own with time, but this could take many months to a few years. Treatments may be done if needed. What are the causes? Warts are caused by a type of virus that is called HPV.  This virus can spread from person to person through touching.  Warts can also spread to other parts of the body when a person scratches a wart and then scratches normal skin. What increases the risk? You are more likely to get warts if:  You are 71-64 years old.  You have a weak body defense system (immune system).  You are Caucasian. What are the signs or symptoms? The main symptom of this condition is small growths on the skin. Warts may:  Be round, oval, or have an uneven shape.  Feel rough to the touch.  Be the color of your skin or light yellow, brown, or gray.  Often be less than  inch (1.3 cm) in size.  Go away and then come back again. Most warts do not hurt, but some can hurt if they are large or if they are on the bottom of your feet. How is this diagnosed? A wart can often be diagnosed by how it looks. In some cases, the doctor might remove a little bit of the wart to test it (biopsy). How is this treated? Most of the time, warts do not need treatment. Sometimes people want warts removed. If treatment is needed or wanted, options may include:  Putting creams or patches with medicine in them on the wart.  Putting duct tape over the top of the wart.  Freezing the wart.  Burning the wart with: ? A laser. ? An electric probe.  Giving a shot of medicine into the wart to help the body's defense system fight off the wart.  Surgery to remove the wart. Follow these instructions at home:  Medicines  Apply  over-the-counter and prescription medicines only as told by your doctor.  Do not apply over-the-counter wart medicines to your face or genitals before you ask your doctor if it is okay to do that. Lifestyle  Keep your body's defense system healthy. To do this: ? Eat a healthy diet. ? Get enough sleep. ? Do not use any products that contain nicotine or tobacco, such as cigarettes and e-cigarettes. If you need help quitting, ask your doctor. General instructions  Wash your hands after you touch a wart.  Do not scratch or pick at a wart.  Avoid shaving hair that is over a wart.  Keep all follow-up visits as told by your doctor. This is important. Contact a doctor if:  Your warts do not get better after treatment.  You have redness, swelling, or pain at the site of a wart.  You have bleeding from a wart, and the bleeding does not stop when you put light pressure on the wart.  You have diabetes and you get a wart. Summary  Warts are small growths on the skin. They are common, and they are caused by a virus.  Most of the time, warts do not need treatment. Sometimes people want warts removed. If treatment is needed or wanted, there are  many options.  Apply over-the-counter and prescription medicines only as told by your doctor.  Wash your hands after you touch a wart.  Keep all follow-up visits as told by your doctor. This is important. This information is not intended to replace advice given to you by your health care provider. Make sure you discuss any questions you have with your health care provider. Document Revised: 05/08/2017 Document Reviewed: 05/08/2017 Elsevier Patient Education  [place].

## 2019-11-04 ENCOUNTER — Encounter: Payer: Self-pay | Admitting: Family Medicine

## 2019-11-04 LAB — CBC WITH DIFFERENTIAL/PLATELET
Basophils Absolute: 0.2 10*3/uL — ABNORMAL HIGH (ref 0.0–0.1)
Basophils Relative: 2.5 % (ref 0.0–3.0)
Eosinophils Absolute: 0.1 10*3/uL (ref 0.0–0.7)
Eosinophils Relative: 1.4 % (ref 0.0–5.0)
HCT: 43.3 % (ref 39.0–52.0)
Hemoglobin: 14.8 g/dL (ref 13.0–17.0)
Lymphocytes Relative: 34.5 % (ref 12.0–46.0)
Lymphs Abs: 2.7 10*3/uL (ref 0.7–4.0)
MCHC: 34.2 g/dL (ref 30.0–36.0)
MCV: 85.3 fl (ref 78.0–100.0)
Monocytes Absolute: 0.5 10*3/uL (ref 0.1–1.0)
Monocytes Relative: 6.2 % (ref 3.0–12.0)
Neutro Abs: 4.3 10*3/uL (ref 1.4–7.7)
Neutrophils Relative %: 55.4 % (ref 43.0–77.0)
Platelets: 240 10*3/uL (ref 150.0–400.0)
RBC: 5.07 Mil/uL (ref 4.22–5.81)
RDW: 14.4 % (ref 11.5–15.5)
WBC: 7.8 10*3/uL (ref 4.0–10.5)

## 2019-11-04 LAB — LIPID PANEL
Cholesterol: 225 mg/dL — ABNORMAL HIGH (ref 0–200)
HDL: 49.7 mg/dL (ref >39.00)
LDL Cholesterol: 152 mg/dL — ABNORMAL HIGH (ref 0–99)
NonHDL: 175.08
Total CHOL/HDL Ratio: 5
Triglycerides: 114 mg/dL (ref 0.0–149.0)
VLDL: 22.8 mg/dL (ref 0.0–40.0)

## 2019-11-04 LAB — COMPREHENSIVE METABOLIC PANEL
ALT: 17 U/L (ref 0–53)
AST: 15 U/L (ref 0–37)
Albumin: 4.5 g/dL (ref 3.5–5.2)
Alkaline Phosphatase: 42 U/L (ref 39–117)
BUN: 14 mg/dL (ref 6–23)
CO2: 27 mEq/L (ref 19–32)
Calcium: 9.2 mg/dL (ref 8.4–10.5)
Chloride: 103 mEq/L (ref 96–112)
Creatinine, Ser: 1.02 mg/dL (ref 0.40–1.50)
GFR: 77.84 mL/min (ref >60.00)
Glucose, Bld: 94 mg/dL (ref 70–99)
Potassium: 4.1 mEq/L (ref 3.5–5.1)
Sodium: 138 mEq/L (ref 135–145)
Total Bilirubin: 0.5 mg/dL (ref 0.2–1.2)
Total Protein: 7 g/dL (ref 6.0–8.3)

## 2019-11-04 LAB — PROTIME-INR
INR: 1 ratio (ref 0.8–1.0)
Prothrombin Time: 11.4 s (ref 9.6–13.1)

## 2019-11-04 LAB — HIV ANTIBODY (ROUTINE TESTING W REFLEX): HIV 1&2 Ab, 4th Generation: NONREACTIVE

## 2019-11-04 LAB — PSA: PSA: 3.59 ng/mL (ref 0.10–4.00)

## 2019-11-04 LAB — HEMOGLOBIN A1C: Hgb A1c MFr Bld: 6.3 % (ref 4.6–6.5)

## 2019-11-04 LAB — VITAMIN D 25 HYDROXY (VIT D DEFICIENCY, FRACTURES): VITD: 29.45 ng/mL — ABNORMAL LOW (ref 30.00–100.00)

## 2019-11-05 ENCOUNTER — Encounter: Payer: Self-pay | Admitting: Family Medicine

## 2019-11-05 ENCOUNTER — Telehealth: Payer: Self-pay

## 2019-11-05 MED ORDER — ATORVASTATIN CALCIUM 20 MG PO TABS: 20.0000 mg | ORAL_TABLET | Freq: Every day | ORAL | 3 refills | Status: DC

## 2019-11-05 NOTE — Addendum Note (Signed)
Addended by: Clarene Reamer B on: 11/05/2019 08:07 AM   Modules accepted: Orders

## 2019-11-05 NOTE — Telephone Encounter (Signed)
Re faxed pre surgical clearance with labs attached.

## 2019-11-05 NOTE — Telephone Encounter (Signed)
Pre surgical clearance faxed to Dr Hector Shade [facility]

## 2019-11-18 DIAGNOSIS — M1712 Unilateral primary osteoarthritis, left knee: Secondary | ICD-10-CM | POA: Diagnosis not present

## 2020-01-30 DIAGNOSIS — L570 Actinic keratosis: Secondary | ICD-10-CM | POA: Diagnosis not present

## 2020-01-30 DIAGNOSIS — Z85828 Personal history of other malignant neoplasm of skin: Secondary | ICD-10-CM | POA: Diagnosis not present

## 2020-04-30 DIAGNOSIS — Z96652 Presence of left artificial knee joint: Secondary | ICD-10-CM | POA: Diagnosis not present

## 2021-11-08 ENCOUNTER — Encounter: Payer: Self-pay | Admitting: Internal Medicine

## 2021-11-08 ENCOUNTER — Encounter: Payer: Self-pay | Admitting: Gastroenterology

## 2021-11-08 ENCOUNTER — Ambulatory Visit (INDEPENDENT_AMBULATORY_CARE_PROVIDER_SITE_OTHER): Payer: Medicare Other | Admitting: Internal Medicine

## 2021-11-08 VITALS — BP 146/80 | HR 68 | Temp 98.0°F | Ht 66.75 in | Wt 189.6 lb

## 2021-11-08 DIAGNOSIS — H9190 Unspecified hearing loss, unspecified ear: Secondary | ICD-10-CM | POA: Insufficient documentation

## 2021-11-08 DIAGNOSIS — Z96652 Presence of left artificial knee joint: Secondary | ICD-10-CM | POA: Diagnosis not present

## 2021-11-08 DIAGNOSIS — M6530 Trigger finger, unspecified finger: Secondary | ICD-10-CM

## 2021-11-08 DIAGNOSIS — E782 Mixed hyperlipidemia: Secondary | ICD-10-CM | POA: Diagnosis not present

## 2021-11-08 DIAGNOSIS — Z23 Encounter for immunization: Secondary | ICD-10-CM

## 2021-11-08 DIAGNOSIS — R972 Elevated prostate specific antigen [PSA]: Secondary | ICD-10-CM

## 2021-11-08 DIAGNOSIS — E663 Overweight: Secondary | ICD-10-CM | POA: Diagnosis not present

## 2021-11-08 DIAGNOSIS — H9319 Tinnitus, unspecified ear: Secondary | ICD-10-CM

## 2021-11-08 DIAGNOSIS — Z860100 Personal history of colon polyps, unspecified: Secondary | ICD-10-CM

## 2021-11-08 DIAGNOSIS — H543 Unqualified visual loss, both eyes: Secondary | ICD-10-CM

## 2021-11-08 DIAGNOSIS — R030 Elevated blood-pressure reading, without diagnosis of hypertension: Secondary | ICD-10-CM

## 2021-11-08 DIAGNOSIS — E559 Vitamin D deficiency, unspecified: Secondary | ICD-10-CM

## 2021-11-08 DIAGNOSIS — M65351 Trigger finger, right little finger: Secondary | ICD-10-CM

## 2021-11-08 DIAGNOSIS — H9193 Unspecified hearing loss, bilateral: Secondary | ICD-10-CM

## 2021-11-08 HISTORY — DX: Presence of left artificial knee joint: Z96.652

## 2021-11-08 HISTORY — DX: Vitamin D deficiency, unspecified: E55.9

## 2021-11-08 HISTORY — DX: Unqualified visual loss, both eyes: H54.3

## 2021-11-08 HISTORY — DX: Overweight: E66.3

## 2021-11-08 HISTORY — DX: Elevated prostate specific antigen (PSA): R97.20

## 2021-11-08 HISTORY — DX: Trigger finger, unspecified finger: M65.30

## 2021-11-08 HISTORY — DX: Tinnitus, unspecified ear: H93.19

## 2021-11-08 HISTORY — DX: Elevated blood-pressure reading, without diagnosis of hypertension: R03.0

## 2021-11-08 NOTE — Assessment & Plan Note (Signed)
Will plan to recheck as part of preventive care labs he plans to do as part of upcoming welcome to medicare visit. He will continue to suppleement in the meantime.

## 2021-11-08 NOTE — Assessment & Plan Note (Signed)
I advised him there is nothing to worry about but physical therapy and hand surgery can be helpful if it starts to annoy him

## 2021-11-08 NOTE — Patient Instructions (Addendum)
It was a pleasure seeing you today! I truly hope you feel like you received 5 star service and please let me know if there is anything I can improve.  Loralee Pacas, MD   Today the plan is... Try the diet plan we discussed that in the print out and try monitoring her blood pressure at home and see how good we can get your blood pressure and cholesterol in the follow-up to see if we can handle it with just diet.  Keep in mind that if you are not able to adhere to this I am going to be strongly recommending blood pressure meds and cholesterol meds at the next visit might even have to add diabetes meds if you are eating a lot of sugary stuff  Your blood pressure trend concerns me- you were 146/80 on 2 checks today. I would like for you to buy/use a home cuff to check at least 4x a week. Your goal is <135/85.  Bring your home cuff and your log of blood pressures with you to next visit. Update me in 2-3 weeks by mychart.    History of colon polyps -     Ambulatory referral to Gastroenterology  Moderate mixed hyperlipidemia not requiring statin therapy  H/O total knee replacement, left Assessment & Plan: Reports zero pain from this Briefly this and that there is a bit of controversy with a tendency towards recommending against antibiotics for procedures in the future to protect the hardware from getting infected I advised for earlier intervention on any sort of infectious symptoms   Trigger little finger of right hand Assessment & Plan: I advised him there is nothing to worry about but physical therapy and hand surgery can be helpful if it starts to annoy him   Overweight Assessment & Plan: Offered meds- he declines for now I have had an 5 minute conversation today with the patient about healthy eating habits, exercise, calorie and carb goals for sustainable and successful weight loss. I gave the patient caloric and protein daily intake values as well as described the importance of  increasing fiber and water intake. I discussed weight loss medications that could be used in the treatment of this patient. Handouts on low carb eating were given to the patient.     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   '[x]'$  If your condition begins to worsen or become severe:  GO to the ER  '[x]'$  If you are not doing well: RETURN to the office sooner  '[x]'$  If you have follow-up questions / concerns: please contact me  -via phone [phone] OR MyChart messaging   '[x]'$  Please bring all your medicines to each appointment   Heart-Healthy Eating Plan Many factors influence your heart health, including eating and exercise habits. Heart health is also called coronary health. Coronary risk increases with abnormal blood fat (lipid) levels. A heart-healthy eating plan includes limiting unhealthy  fats, increasing healthy fats, limiting salt (sodium) intake, and making other diet and lifestyle changes. What is my plan? Your health care provider may recommend that: You limit your fat intake to ___unlimited______% or less of your total calories each day. You limit your saturated fat intake to ___20______% or less of your total calories each day. butter, ghee, suet, lard, coconut oil and palm oil. cakes. biscuits. fatty cuts of meat. sausages. bacon. cured meats like salami, chorizo and pancetta. cheese. You limit the  amount of cholesterol in your diet to less than ___200______ mg per day. Red meat, like beef, pork, and lamb, as well as processed meats like sausage. Full-fat dairy, like cream, whole milk, and butter. Baked goods and sweets. Fried foods. Tropical oils such as palm oil and coconut oil. Butter. You limit the amount of sodium in your diet to less than ___2000______ mg per day. What are tips for following this plan? Cooking Cook foods using methods other than frying. Baking, boiling, grilling, and broiling are all good options. Other ways to reduce fat include: Removing the skin from poultry. Removing all visible fats from meats. Steaming vegetables in water or broth. Meal planning  At meals, imagine dividing your plate into fourths: Fill one-half of your plate with vegetables and green salads. Fill one-fourth of your plate with whole grains. Fill one-fourth of your plate with lean protein foods. Eat 2-4 cups of vegetables per day. One cup of vegetables equals 1 cup (91 g) broccoli or cauliflower florets, 2 medium carrots, 1 large bell pepper, 1 large sweet potato, 1 large tomato, 1 medium white potato, 2 cups (150 g) raw leafy greens. Eat 1-2 cups of fruit per day. One cup of fruit equals 1 small apple, 1 large banana, 1 cup (237 g) mixed fruit, 1 large orange,  cup (82 g) dried fruit, 1 cup (240 mL) 100% fruit juice. Eat more foods that contain soluble fiber. Examples include apples, broccoli, carrots, beans, peas, and barley. Aim to get 25-30 g of fiber per day. Increase your consumption of legumes, nuts, and seeds to 4-5 servings per week. One serving of dried beans or legumes equals  cup (90 g) cooked, 1 serving of nuts is  oz (12 almonds, 24 pistachios, or 7 walnut halves), and 1 serving of seeds equals  oz (8 g). Fats Choose healthy fats more often. Choose monounsaturated and polyunsaturated fats, such as olive and canola oils, avocado oil, flaxseeds, walnuts, almonds, and  seeds. Eat more omega-3 fats. Choose salmon, mackerel, sardines, tuna, flaxseed oil, and ground flaxseeds. Aim to eat fish at least 2 times each week. Check food labels carefully to identify foods with trans fats or high amounts of saturated fat. Limit saturated fats. These are found in animal products, such as meats, butter, and cream. Plant sources of saturated fats include palm oil, palm kernel oil, and coconut oil. Avoid foods with partially hydrogenated oils in them. These contain trans fats. Examples are stick margarine, some tub margarines, cookies, crackers, and other baked goods. Avoid fried foods. General information Eat more home-cooked food and less restaurant, buffet, and fast food. Limit or avoid alcohol. Limit foods that are high in added sugar and simple starches such as foods made using white refined flour (white breads, pastries, sweets). Lose weight if you are overweight. Losing just 5-10% of your body weight can help your overall health and prevent diseases such as diabetes and heart disease. Monitor your sodium intake, especially if you have high blood pressure. Talk  with your health care provider about your sodium intake. Try to incorporate more vegetarian meals weekly. What foods should I eat? Fruits All fresh, canned (in natural juice), or frozen fruits. Vegetables Fresh or frozen vegetables (raw, steamed, roasted, or grilled). Green salads. Grains Most grains. Choose whole wheat and whole grains most of the time. Rice and pasta, including brown rice and pastas made with whole wheat. Meats and other proteins Lean, well-trimmed beef, veal, pork, and lamb. Chicken and [place] without skin. All fish and shellfish. Wild duck, rabbit, pheasant, and venison. Egg whites or low-cholesterol egg substitutes. Dried beans, peas, lentils, and tofu. Seeds and most nuts. Dairy Low-fat or nonfat cheeses, including ricotta and mozzarella. Skim or 1% milk (liquid, powdered, or  evaporated). Buttermilk made with low-fat milk. Nonfat or low-fat yogurt. Fats and oils Non-hydrogenated (trans-free) margarines. Vegetable oils, including soybean, sesame, sunflower, olive, avocado, peanut, safflower, corn, canola, and cottonseed. Salad dressings or mayonnaise made with a vegetable oil. Beverages Water (mineral or sparkling). Coffee and tea. Unsweetened ice tea. Diet beverages. Sweets and desserts Sherbet, gelatin, and fruit ice. Small amounts of dark chocolate. Limit all sweets and desserts. Seasonings and condiments All seasonings and condiments. The items listed above may not be a complete list of foods and beverages you can eat. Contact a dietitian for more options. What foods should I avoid? Fruits Canned fruit in heavy syrup. Fruit in cream or butter sauce. Fried fruit. Limit coconut. Vegetables Vegetables cooked in cheese, cream, or butter sauce. Fried vegetables. Grains Breads made with saturated or trans fats, oils, or whole milk. Croissants. Sweet rolls. Donuts. High-fat crackers, such as cheese crackers and chips. Meats and other proteins Fatty meats, such as hot dogs, ribs, sausage, bacon, rib-eye roast or steak. High-fat deli meats, such as salami and bologna. Caviar. Domestic duck and goose. Organ meats, such as liver. Dairy Cream, sour cream, cream cheese, and creamed cottage cheese. Whole-milk cheeses. Whole or 2% milk (liquid, evaporated, or condensed). Whole buttermilk. Cream sauce or high-fat cheese sauce. Whole-milk yogurt. Fats and oils Meat fat, or shortening. Cocoa butter, hydrogenated oils, palm oil, coconut oil, palm kernel oil. Solid fats and shortenings, including bacon fat, salt pork, lard, and butter. Nondairy cream substitutes. Salad dressings with cheese or sour cream. Beverages Regular sodas and any drinks with added sugar. Sweets and desserts Frosting. Pudding. Cookies. Cakes. Pies. Milk chocolate or white chocolate. Buttered syrups.  Full-fat ice cream or ice cream drinks. The items listed above may not be a complete list of foods and beverages to avoid. Contact a dietitian for more information. Summary Heart-healthy meal planning includes limiting unhealthy fats, increasing healthy fats, limiting salt (sodium) intake and making other diet and lifestyle changes. Lose weight if you are overweight. Losing just 5-10% of your body weight can help your overall health and prevent diseases such as diabetes and heart disease. Focus on eating a balance of foods, including fruits and vegetables, low-fat or nonfat dairy, lean protein, nuts and legumes, whole grains, and heart-healthy oils and fats. This information is not intended to replace advice given to you by your health care provider. Make sure you discuss any questions you have with your health care provider. Document Revised: 01/24/2021 Document Reviewed: 01/24/2021 Elsevier Patient Education  [place].

## 2021-11-08 NOTE — Assessment & Plan Note (Signed)
Offered meds- he declines for now I have had an 5 minute conversation today with the patient about healthy eating habits, exercise, calorie and carb goals for sustainable and successful weight loss. I gave the patient caloric and protein daily intake values as well as described the importance of increasing fiber and water intake. I discussed weight loss medications that could be used in the treatment of this patient. Handouts on low carb eating were given to the patient.

## 2021-11-08 NOTE — Progress Notes (Signed)
[place] ---   Phone: [phone]  New patient visit  Visit Date: 11/08/2021 Patient: Darren Bryant   DOB: 12/16/1955   66 y.o. Male  MRN: [id number]   Today's healthcare provider: Loralee Pacas, MD  Assessment and Plan:   Shawon was seen today for transitions of care and anorexia.  Overweight Overview: Mostly truncal  Assessment & Plan: Offered meds- he declines for now I have had an 5 minute conversation today with the patient about healthy eating habits, exercise, calorie and carb goals for sustainable and successful weight loss. I gave the patient caloric and protein daily intake values as well as described the importance of increasing fiber and water intake. I discussed weight loss medications that could be used in the treatment of this patient. Handouts on low carb eating were given to the patient.     History of colon polyps -     Ambulatory referral to Gastroenterology  Moderate mixed hyperlipidemia not requiring statin therapy Overview: Never took atorvastatin as recommended from 2012 Tried adjustment of diet but has been working on the road- doing Press photographer.     H/O total knee replacement, left Overview: Replaced by Dr. Jeffie Pollock around nov 2021  Assessment & Plan: Reports zero pain from this Briefly this and that there is a bit of controversy with a tendency towards recommending against antibiotics for procedures in the future to protect the hardware from getting infected I advised for earlier intervention on any sort of infectious symptoms   Trigger little finger of right hand Overview: Right pinky finger kind of has on even movement and flex through the proximal interphalangeal joint and a nonsmooth way Defers hand surgery and physical therapy for now  Assessment & Plan: I advised him there is nothing to worry about but physical therapy and hand surgery can be helpful if it starts to annoy him   Impaired vision in both  eyes Overview: [facility] and wears bifocals for bad reading and requiring distance vision correction   Bilateral hearing loss, unspecified hearing loss type Overview: Difficulty distinguishing when multiple voices are going at the same time but no gross hearing deficit in conversational clinic visits   Tinnitus, unspecified laterality Overview: intermittent   Elevated blood pressure reading Overview: BP Readings from Last 3 Encounters:  11/08/21 (!) 146/80  11/03/19 (!) 148/80  02/15/17 138/74     Assessment & Plan: Sodium limit the diet and the heart healthy after visit print out and we can recheck in an upcoming Medicare welcome visit and if still elevated will start medications at that time he is going to check his blood pressure in the meantime with Omron see avs   Elevated prostate specific antigen (PSA) Overview: Lab Results  Component Value Date   PSA 3.59 11/03/2019   PSA 2.42 12/18/2014   PSA 2.28 11/25/2013   Denies urine symptoms but he does have a PSA rising as shown No fh prostate cancer.   Vitamin D deficiency Overview: Reports he is taking vit d  since  Lab Results  Component Value Date/Time   VD25OH 29.45 (L) 11/03/2019 03:18 PM     Assessment & Plan: Will plan to recheck as part of preventive care labs he plans to do as part of upcoming welcome to medicare visit. He will continue to suppleement in the meantime.   Need for influenza vaccination  Need for immunization against influenza -     Flu Vaccine  QUAD High Dose(Fluad)     Noted:  Health Maintenance  Topic Date Due   Medicare Annual Wellness (AWV)  Never done   COVID-19 Vaccine (5 - Mixed Product risk series) 06/16/2019   Zoster Vaccines- Shingrix (1 of 2) 02/08/2022 (Originally 08/24/1974)   Pneumonia Vaccine 37+ Years old (1 - PCV) 11/09/2022 (Originally 08/23/2020)   COLONOSCOPY (Pts 45-77yr Insurance coverage will need to be confirmed)   11/09/2022 (Originally 01/03/2019)   TETANUS/TDAP  11/09/2022 (Originally 05/14/2018)   INFLUENZA VACCINE  Completed   Hepatitis C Screening  Completed   HPV VACCINES  Aged Out  He will do flu vaccine today, shingles in feb,  arrange colonoscopy which is over due actually per records.  Will do welcome to medicare next month and close remaining care gaps- focus today was getting problem list created and addressed and he prefers to wait.      Subjective:  Patient presents today to establish care.  Prior patient of GCarlean Purlbut hasn't been seen in over 10 years..  Chief Complaint  Patient presents with   Transitions Of Care   Anorexia    Pt had no questions or concerns     For history taking, I took a per problem history from the patient and chart review as follows: Problem  H/O Total Knee Replacement, Left   Replaced by Dr. AJeffie Pollockaround nov 2021   Trigger Finger of Right Hand   Right pinky finger kind of has on even movement and flex through the proximal interphalangeal joint and a nonsmooth way Defers hand surgery and physical therapy for now   Overweight   Mostly truncal   Impaired Vision in Both Eyes   [facility] and wears bifocals for bad reading and requiring distance vision correction   Hearing Impairment   Difficulty distinguishing when multiple voices are going at the same time but no gross hearing deficit in conversational clinic visits   Tinnitus   intermittent   Elevated Blood Pressure Reading   BP Readings from Last 3 Encounters:  11/08/21 (!) 146/80  11/03/19 (!) 148/80  02/15/17 138/74      Elevated Prostate Specific Antigen (Psa)   Lab Results  Component Value Date   PSA 3.59 11/03/2019   PSA 2.42 12/18/2014   PSA 2.28 11/25/2013   Denies urine symptoms but he does have a PSA rising as shown No fh prostate cancer.   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   The following were reviewed and entered/updated in epic: Past Medical History:  Diagnosis Date   Elevated blood pressure reading 11/08/2021   BP Readings from Last 3 Encounters: 11/08/21 (!) 146/80 11/03/19 (!) 148/80 02/15/17 138/74     Elevated prostate specific antigen (PSA) 11/08/2021  Lab Results Component Value Date  PSA 3.59 11/03/2019  PSA 2.42 12/18/2014  PSA 2.28 11/25/2013  Denies urine symptoms but he does have a PSA rising as shown   H/O total knee replacement, 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 11/08/2021   [facility] and wears bifocals for bad reading and requiring distance vision correction   Overweight 11/08/2021   Mostly truncal   Tinnitus 11/08/2021   intermittent   Trigger finger of right hand 11/08/2021   Right pinky finger kind of has on even movement and flex through the proximal interphalangeal joint and a nonsmooth way   Vitamin D deficiency 11/08/2021   Reports he is taking vit d  since  Lab Results Component Value Date/Time  VD25OH 29.45 (L) 11/03/2019 03:18 PM     Past Surgical History:  Procedure Laterality Date   HERNIA REPAIR     KNEE SURGERY Bilateral    Past Surgical History:  Procedure Laterality Date   HERNIA REPAIR     KNEE SURGERY Bilateral    Family History  Problem Relation Age of Onset   Heart disease Mother    Heart attack Mother    Hearing loss  Father    Hypertension Father    Stroke Brother    Heart disease Other    Stroke Other    Hyperlipidemia Other    Outpatient Medications Prior to Visit  Medication Sig Dispense Refill   atorvastatin (LIPITOR) 20 MG tablet Take 1 tablet (20 mg total) by mouth daily. 90 tablet 3   b complex vitamins tablet Take 1 tablet by mouth daily.     Cholecalciferol (D3 ADULT PO) Take by mouth daily.     albuterol (PROVENTIL HFA;VENTOLIN HFA) 108 (90 Base) MCG/ACT inhaler Inhale 2 puffs into the lungs every 6 (six) hours as needed for wheezing or shortness of breath. (Patient not taking: Reported on 11/03/2019) 1 Inhaler 0   ibuprofen (ADVIL,MOTRIN) 200 MG tablet Take 200 mg by mouth every 6 (six) hours as needed for mild pain or moderate pain. Reported on 12/18/2014     No facility-administered medications prior to visit.    No Known Allergies Social History   Tobacco Use   Smoking status: Never   Smokeless tobacco: Never  Substance Use Topics   Alcohol use: Yes    Alcohol/week: 10.0 standard drinks of alcohol    Types: 10 Standard drinks or equivalent per week    Comment: 10 drinks per week   Drug use: No    Immunization History  Administered Date(s) Administered   Fluad Quad(high Dose 65+) 11/08/2021   Influenza Split 10/25/2012   Influenza, Seasonal, Injecte, Preservative Fre 12/07/2011   Influenza,inj,Quad PF,6+ Mos 10/25/2012, 11/25/2013, 10/29/2014, 12/04/2015, 11/03/2019   Influenza,inj,quad, With Preservative 01/02/2017   Moderna Sars-Covid-2 Vaccination 03/03/2019, 04/03/2019   PFIZER(Purple Top)SARS-COV-2 Vaccination 03/27/2019, 04/21/2019   Tdap 05/13/2008    ROS   Objective:  BP (!) 146/80 (BP Location: Left Arm, Patient Position: Sitting)   Pulse 68   Temp 98 F (36.7 C) (Temporal)   Ht 5' 6.75" (1.695 m)   Wt 189 lb 9.6 oz (86 kg)   SpO2 96%   BMI 29.92 kg/m  Body mass index is 29.92 kg/m.  He  is a very cordial and polite person who was a pleasure to meet.    Physical Exam Constitutional:  General: He is awake. He is not in acute distress.    Appearance: Normal appearance. He is well-developed. He is not ill-appearing, toxic-appearing or diaphoretic.  HENT:     Head: Normocephalic and atraumatic.     Nose: Nose normal.  Eyes:     General: Lids are normal. No scleral icterus.    Conjunctiva/sclera: Conjunctivae normal.  Cardiovascular:     Rate and Rhythm: Normal rate and regular rhythm.  Pulmonary:     Effort: Pulmonary effort is normal. No retractions.     Breath sounds: Normal breath sounds.  Neurological:     General: No focal deficit present.     Mental Status: He is alert.  Psychiatric:        Mood and Affect: Mood normal.        Behavior: Behavior normal.        Thought Content: Thought content normal.        Judgment: Judgment normal.      Reviewed all results available in chart. Results for orders placed or performed in visit on 11/03/19  CBC with Differential  Result Value Ref Range   WBC 7.8 4.0 - 10.5 K/uL   RBC 5.07 4.22 - 5.81 Mil/uL   Hemoglobin 14.8 13.0 - 17.0 g/dL   HCT 43.3 39.0 - 52.0 %   MCV 85.3 78.0 - 100.0 fl   MCHC 34.2 30.0 - 36.0 g/dL   RDW 14.4 11.5 - 15.5 %   Platelets 240.0 150.0 - 400.0 K/uL   Neutrophils Relative % 55.4 43.0 - 77.0 %   Lymphocytes Relative 34.5 12.0 - 46.0 %   Monocytes Relative 6.2 3.0 - 12.0 %   Eosinophils Relative 1.4 0.0 - 5.0 %   Basophils Relative 2.5 0.0 - 3.0 %   Neutro Abs 4.3 1.4 - 7.7 K/uL   Lymphs Abs 2.7 0.7 - 4.0 K/uL   Monocytes Absolute 0.5 0.1 - 1.0 K/uL   Eosinophils Absolute 0.1 0.0 - 0.7 K/uL   Basophils Absolute 0.2 (H) 0.0 - 0.1 K/uL  Comprehensive metabolic panel  Result Value Ref Range   Sodium 138 135 - 145 mEq/L   Potassium 4.1 3.5 - 5.1 mEq/L   Chloride 103 96 - 112 mEq/L   CO2 27 19 - 32 mEq/L   Glucose, Bld 94 70 - 99 mg/dL   BUN 14 6 - 23 mg/dL   Creatinine, Ser 1.02 0.40 - 1.50 mg/dL   Total Bilirubin 0.5 0.2 - 1.2 mg/dL    Alkaline Phosphatase 42 39 - 117 U/L   AST 15 0 - 37 U/L   ALT 17 0 - 53 U/L   Total Protein 7.0 6.0 - 8.3 g/dL   Albumin 4.5 3.5 - 5.2 g/dL   GFR 77.84 >60.00 mL/min   Calcium 9.2 8.4 - 10.5 mg/dL  Protime-INR  Result Value Ref Range   INR 1.0 0.8 - 1.0 ratio   Prothrombin Time 11.4 9.6 - 13.1 sec  PSA  Result Value Ref Range   PSA 3.59 0.10 - 4.00 ng/mL  Hemoglobin A1c  Result Value Ref Range   Hgb A1c MFr Bld 6.3 4.6 - 6.5 %  Lipid Panel  Result Value Ref Range   Cholesterol 225 (H) 0 - 200 mg/dL   Triglycerides 114.0 0.0 - 149.0 mg/dL   HDL 49.70 >39.00 mg/dL   VLDL 22.8 0.0 - 40.0 mg/dL   LDL Cholesterol 152 (H) 0 - 99 mg/dL   Total CHOL/HDL Ratio 5    NonHDL  175.08   HIV Antibody (routine testing w rflx)  Result Value Ref Range   HIV 1&2 Ab, 4th Generation NON-REACTIVE NON-REACTI  Vitamin D, 25-hydroxy  Result Value Ref Range   VITD 29.45 (L) 30.00 - 100.00 ng/mL

## 2021-11-08 NOTE — Assessment & Plan Note (Signed)
Sodium limit the diet and the heart healthy after visit print out and we can recheck in an upcoming Medicare welcome visit and if still elevated will start medications at that time he is going to check his blood pressure in the meantime with [facility] see avs

## 2021-11-08 NOTE — Assessment & Plan Note (Signed)
Reports zero pain from this Briefly this and that there is a bit of controversy with a tendency towards recommending against antibiotics for procedures in the future to protect the hardware from getting infected I advised for earlier intervention on any sort of infectious symptoms

## 2021-11-29 ENCOUNTER — Ambulatory Visit (AMBULATORY_SURGERY_CENTER): Payer: Self-pay | Admitting: *Deleted

## 2021-11-29 VITALS — Ht 66.75 in | Wt 188.0 lb

## 2021-11-29 DIAGNOSIS — Z860100 Personal history of colon polyps, unspecified: Secondary | ICD-10-CM

## 2021-11-29 MED ORDER — NA SULFATE-K SULFATE-MG SULF 17.5-3.13-1.6 GM/177ML PO SOLN: 1.0000 | Freq: Once | ORAL | 0 refills | Status: AC

## 2021-11-29 NOTE — Progress Notes (Signed)
No egg or soy allergy known to patient  No issues known to pt with past sedation with any surgeries or procedures Patient denies ever being told they had issues or difficulty with intubation  No FH of Malignant Hyperthermia Pt is not on diet pills Pt is not on  home 02  Pt is not on blood thinners  Pt denies issues with constipation  Pt encouraged to use to use Singlecare or Goodrx to reduce cost  In person  Patient's chart reviewed by Osvaldo Angst CNRA prior to previsit and patient appropriate for the [place].  Previsit completed and red dot placed by patient's name on their procedure day (on provider's schedule).  . Coupon given

## 2021-12-02 ENCOUNTER — Telehealth: Payer: Self-pay | Admitting: Internal Medicine

## 2021-12-02 NOTE — Telephone Encounter (Signed)
Pt States: -Tuesday 11/29/21 14 hours with bad case of flu. -Starting to have nasal congestion and sniffles. -Read that TamiFlu is preventative.   Pt Requests: -TamiFlu be sent to pharmacy    Pharmacy: [facility] at  [place], [place] [address] [address]: [place]   Phone   [phone]

## 2021-12-07 ENCOUNTER — Telehealth: Payer: Self-pay | Admitting: Internal Medicine

## 2021-12-07 ENCOUNTER — Encounter: Payer: Self-pay | Admitting: Internal Medicine

## 2021-12-07 ENCOUNTER — Ambulatory Visit (INDEPENDENT_AMBULATORY_CARE_PROVIDER_SITE_OTHER): Payer: Medicare Other | Admitting: Internal Medicine

## 2021-12-07 VITALS — BP 123/68 | HR 64 | Temp 98.0°F | Resp 12 | Ht 66.75 in | Wt 185.4 lb

## 2021-12-07 DIAGNOSIS — H9193 Unspecified hearing loss, bilateral: Secondary | ICD-10-CM | POA: Diagnosis not present

## 2021-12-07 DIAGNOSIS — H543 Unqualified visual loss, both eyes: Secondary | ICD-10-CM

## 2021-12-07 DIAGNOSIS — E782 Mixed hyperlipidemia: Secondary | ICD-10-CM | POA: Diagnosis not present

## 2021-12-07 DIAGNOSIS — R7303 Prediabetes: Secondary | ICD-10-CM | POA: Diagnosis not present

## 2021-12-07 DIAGNOSIS — E663 Overweight: Secondary | ICD-10-CM

## 2021-12-07 DIAGNOSIS — E559 Vitamin D deficiency, unspecified: Secondary | ICD-10-CM

## 2021-12-07 NOTE — Progress Notes (Signed)
Chief Complaint  Patient presents with   Annual Exam    Medical Annual Wellness.    Subjective: Pt here for Initial Welcome to Medicare Evaluation.  All testing and counseling done directly by physician (except vitals entered by [facility] and reviewed by physician)  Vision Screen: left eye 20/100, right eye 20/50 - is already following with [facility] finger rubs at 6 inches bilateral marked hearing loss - declines  audiometry referral  Past Medical History:  Diagnosis Date   Allergy    Elevated blood pressure reading 11/08/2021   BP Readings from Last 3 Encounters: 11/08/21 (!) 146/80 11/03/19 (!) 148/80 02/15/17 138/74     Elevated prostate specific antigen (PSA) 11/08/2021   Lab Results Component Value Date  PSA 3.59 11/03/2019  PSA 2.42 12/18/2014  PSA 2.28 11/25/2013  Denies urine symptoms but he does have a PSA rising as shown   H/O total knee replacement, 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 11/08/2021   [facility] and wears bifocals for bad reading and requiring distance vision correction   Overweight 11/08/2021   Mostly truncal   Tinnitus 11/08/2021   intermittent   Trigger finger of right hand 11/08/2021   Right pinky finger kind of has on even movement and flex through the proximal interphalangeal joint and a nonsmooth way   Vitamin D deficiency 11/08/2021   Reports he is taking vit d  since  Lab Results Component Value Date/Time  VD25OH 29.45 (L) 11/03/2019 03:18 PM     Family History  Problem Relation Age of Onset   Heart disease Mother    Heart attack Mother    Hearing loss Father    Hypertension Father    Stroke Brother    Pancreatic cancer Brother    Heart disease Other    Stroke Other    Hyperlipidemia Other    Colon cancer Neg Hx    Colon polyps Neg Hx    Rectal cancer Neg Hx    Esophageal cancer Neg Hx    Stomach cancer Neg Hx     Past Surgical History:  Procedure Laterality Date   HERNIA REPAIR     KNEE SURGERY Bilateral    Current Outpatient Medications on File Prior to Visit  Medication Sig Dispense Refill   b complex vitamins tablet Take 1 tablet by mouth daily.     Cholecalciferol (D3 ADULT PO) Take by mouth daily.     Na Sulfate-K Sulfate-Mg Sulf 17.5-3.13-1.6 GM/177ML SOLN SMARTSIG:1 Kit(s) By Mouth Once     No current facility-administered medications on file prior to visit.   No Known Allergies  Denies Smoking, alcohol/drugs, uses sunscreen  Memory testing:    12/07/2021    5:03 PM  6CIT Screen  What Year? 0 points  What month? 0 points  What time? 0 points  Count back from 20 0 points  Months in reverse 0 points  Repeat phrase 0 points  Total Score 0 points      Mental Health/Substance abuse evaluation: Yes  PHQ-2:  Feelings of depression? No Loss of satisfaction/pleasure in doing things? No   Fall Risk: Less than 2 falls within the past 12 months? Yes  Mindful of grabbing bars in bathroom, ruffles in rugs, poorly lit areas, handrails on the stairs? Yes   Discussion of functional ability done: Encouraged to maintain physical activity and flexibility. Live alone?  Yes  Need help with the following? Bathing: No  Managing money: No  Taking medications: No  Telephone use: NO Transportation: No Shopping: No  Preparing meals: No   Hearing/Vision screen: Trouble hearing TV or radio when others do not? No  Straining or struggling to hear/understand conversation? Yes    Screening done and positive for hearing and vision issues  Fire safety: Have a working smoke alarm? Yes   Diet Balanced diet? Yes  3 meals daily? Yes  Assistance needed? No   BP 123/68 (BP Location: Right Arm, Patient Position: Standing)   Pulse 64   Temp 98 F (36.7 C) (Temporal)   Resp 12   Ht 5' 6.75" (1.695 m)   Wt 185 lb 6.4 oz (84.1 kg)   SpO2 97%   BMI 29.26 kg/m   End of life care  planning/counseling: yes- he has will on file, and advance directives as dnr but not filed with [place] yet Does patient wish to discuss end of life care/planning? No  Does the patient have an advanced directive? Yes  Patient code status/living will: DNR Forms given? No , he redid will and restates he is DNR.  I encouraged him to get forms on file with signatures for that. Printed [place] from internet  Patient Care Team: Loralee Pacas, MD as PCP - General (Internal Medicine)  Opioid Abuse screening Is the patient on opioids? No, the patient is not on opioids Opioid use disorder risk factors? no Pain severity and current plan: no chronic pain  Substance use disorder screening Any alcohol or drug use? Alcohol 2-3 glasses beer or drink total then 2-3 nightly. Risk factors: no history of heavy drinking in over 50 years.  Doesn't use any other substances Referral placed? 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 The patient voiced understanding and agreement to the plan.  Loralee Pacas, DO 12/07/21 9:06 PM

## 2021-12-07 NOTE — Telephone Encounter (Signed)
Following pt's welcome to medicare visit, PCP wanted patient to get scheduled for annual wellness visit with [facility].   May you schedule him for this? He prefers morning appointments.

## 2021-12-07 NOTE — Patient Instructions (Addendum)
It was a pleasure seeing you today!  Your health and satisfaction are my top priorities. If you believe your experience today was worthy of a 5-star rating, I'd be grateful for your feedback! Loralee Pacas, MD   CHECKOUT CHECKLIST  '[]'$    Schedule next appointment(s):    1 year f/u Any requested lab visits should be scheduled as appointments too  If you are not doing well:  Return to the office sooner Please bring all your medicine bottles to each appointment If your condition begins to worsen or become severe:  go to the emergency room or even call 911    REMINDERS:  '[]'$    Please get a POST or advance directive filed with [place] and/or the state. Advance Directive  Advance directives are legal documents that allow you to make decisions about your health care and medical treatment in case you become unable to communicate for yourself. Advance directives let your wishes be known to family, friends, and health care providers. Discussing and writing advance directives should happen over time rather than all at once. Advance directives can be changed and updated at any time. There are different types of advance directives, such as: Medical power of attorney. Living will. Do not resuscitate (DNR) order or do not attempt resuscitation (DNAR) order. Health care proxy and medical power of attorney A health care proxy is also called a health care agent. This person is appointed to make medical decisions for you when you are unable to make decisions for yourself. Generally, people ask a trusted friend or family member to act as their proxy and represent their preferences. Make sure you have an agreement with your trusted person to act as your proxy. A proxy may have to make a medical decision on your behalf if your wishes are not known. A medical power of attorney, also called a durable power of attorney for health care, is a legal document that names your health care proxy. Depending on the laws in your  state, the document may need to be: Signed. Notarized. Dated. Copied. Witnessed. Incorporated into your medical record. You may also want to appoint a trusted person to manage your money in the event you are unable to do so. This is called a durable power of attorney for finances. It is a separate legal document from the durable power of attorney for health care. You may choose your health care proxy or someone different to act as your agent in money matters. If you do not appoint a proxy, or there is a concern that the proxy is not acting in your best interest, a court may appoint a guardian to act on your behalf. Living will A living will is a set of instructions that state your wishes about medical care when you cannot express them yourself. Health care providers should keep a copy of your living will in your medical record. You may want to give a copy to family members or friends. To alert caregivers in case of an emergency, you can place a card in your wallet to let them know that you have a living will and where they can find it. A living will is used if you become: Terminally ill. Disabled. Unable to communicate or make decisions. The following decisions should be included in your living will: To use or not to use life support equipment, such as dialysis machines and breathing machines (ventilators). Whether you want a DNR or DNAR order. This tells health care providers not to use  cardiopulmonary resuscitation (CPR) if breathing or heartbeat stops. To use or not to use tube feeding. To be given or not to be given food and fluids. Whether you want comfort (palliative) care when the goal becomes comfort rather than a cure. Whether you want to donate your organs and tissues. A living will does not give instructions for distributing your money and property if you should pass away. DNR or DNAR A DNR or DNAR order is a request not to have CPR in the event that your heart stops beating or you  stop breathing. If a DNR or DNAR order has not been made and shared, a health care provider will try to help any patient whose heart has stopped or who has stopped breathing. If you plan to have surgery, talk with your health care provider about how your DNR or DNAR order will be followed if problems occur. What if I do not have an advance directive? Some states assign family decision makers to act on your behalf if you do not have an advance directive. Each state has its own laws about advance directives. You may want to check with your health care provider, attorney, or state representative about the laws in your state. Summary Advance directives are legal documents that allow you to make decisions about your health care and medical treatment in case you become unable to communicate for yourself. The process of discussing and writing advance directives should happen over time. You can change and update advance directives at any time. Advance directives may include a medical power of attorney, a living will, and a DNR or DNAR order. This information is not intended to replace advice given to you by your health care provider. Make sure you discuss any questions you have with your health care provider. Document Revised: 09/23/2019 Document Reviewed: 09/23/2019 Elsevier Patient Education  [place].  form also known as advanced directives form completed and dropped off with our office at your leisure  '[]'$    X-rays can be obtained at the  [facility] office. You can walk in M-F between 8:30am- noon or 1pm - 5pm. Tell them you are there for xrays ordered by me. They will send me the results, then I will let you know the results with instructions. Address: [address].  The Xray department is located in the basement.     '[]'$   (Optional):  Review your clinical notes on MyChart after they are completed.     Today's draft of the physician documented plan for today's visit: (final  revisions will be visible on MyChart chart later) There are no diagnoses linked to this encounter.    QUESTIONS & CONCERNS: CLINICAL: please contact us via phone [phone] OR MyChart messaging  LAB & IMAGING:   We will call you if the results are significantly abnormal or you don't use MyChart.  Most normal results will be posted to MyChart immediately and have a clinical review message by Dr. Randol Kern posted within 2-3 business days.   If you have not heard from [place] regarding the results in 2 weeks OR if you need priority reporting, please contact this office. MYCHART:  The fastest way to get your results and easiest way to stay in touch with [place] is by activating your My Chart account. Instructions are located on the last page of this paperwork.  BILLING: xray and lab orders are billed from separate companies and questions./concerns should be directed to the [facility].  For  visit charges please discuss with our administrative services COMPLAINTS:  please let Dr. Randol Kern know or see the [facility], by asking at the front desk: we want you to be satisfied with every experience and we would be grateful for the opportunity to address any problems

## 2021-12-12 ENCOUNTER — Other Ambulatory Visit (INDEPENDENT_AMBULATORY_CARE_PROVIDER_SITE_OTHER): Payer: Medicare Other

## 2021-12-12 DIAGNOSIS — R7303 Prediabetes: Secondary | ICD-10-CM

## 2021-12-12 DIAGNOSIS — E782 Mixed hyperlipidemia: Secondary | ICD-10-CM | POA: Diagnosis not present

## 2021-12-12 DIAGNOSIS — E559 Vitamin D deficiency, unspecified: Secondary | ICD-10-CM | POA: Diagnosis not present

## 2021-12-12 DIAGNOSIS — H9193 Unspecified hearing loss, bilateral: Secondary | ICD-10-CM | POA: Diagnosis not present

## 2021-12-12 DIAGNOSIS — H543 Unqualified visual loss, both eyes: Secondary | ICD-10-CM | POA: Diagnosis not present

## 2021-12-12 DIAGNOSIS — E663 Overweight: Secondary | ICD-10-CM

## 2021-12-12 LAB — CBC
HCT: 44.6 % (ref 39.0–52.0)
Hemoglobin: 15.2 g/dL (ref 13.0–17.0)
MCHC: 34 g/dL (ref 30.0–36.0)
MCV: 85.7 fl (ref 78.0–100.0)
Platelets: 211 10*3/uL (ref 150.0–400.0)
RBC: 5.2 Mil/uL (ref 4.22–5.81)
RDW: 14.5 % (ref 11.5–15.5)
WBC: 6.4 10*3/uL (ref 4.0–10.5)

## 2021-12-12 LAB — LIPID PANEL
Cholesterol: 232 mg/dL — ABNORMAL HIGH (ref 0–200)
HDL: 46.9 mg/dL (ref >39.00)
NonHDL: 184.79
Total CHOL/HDL Ratio: 5
Triglycerides: 227 mg/dL — ABNORMAL HIGH (ref 0.0–149.0)
VLDL: 45.4 mg/dL — ABNORMAL HIGH (ref 0.0–40.0)

## 2021-12-12 LAB — COMPREHENSIVE METABOLIC PANEL
ALT: 15 U/L (ref 0–53)
AST: 13 U/L (ref 0–37)
Albumin: 4.3 g/dL (ref 3.5–5.2)
Alkaline Phosphatase: 45 U/L (ref 39–117)
BUN: 16 mg/dL (ref 6–23)
CO2: 27 mEq/L (ref 19–32)
Calcium: 9.4 mg/dL (ref 8.4–10.5)
Chloride: 102 mEq/L (ref 96–112)
Creatinine, Ser: 1.1 mg/dL (ref 0.40–1.50)
GFR: 70.05 mL/min (ref >60.00)
Glucose, Bld: 107 mg/dL — ABNORMAL HIGH (ref 70–99)
Potassium: 4.4 mEq/L (ref 3.5–5.1)
Sodium: 137 mEq/L (ref 135–145)
Total Bilirubin: 0.5 mg/dL (ref 0.2–1.2)
Total Protein: 6.7 g/dL (ref 6.0–8.3)

## 2021-12-12 LAB — VITAMIN D 25 HYDROXY (VIT D DEFICIENCY, FRACTURES): VITD: 32.13 ng/mL (ref 30.00–100.00)

## 2021-12-12 LAB — HEMOGLOBIN A1C: Hgb A1c MFr Bld: 6.3 % (ref 4.6–6.5)

## 2021-12-12 LAB — TSH: TSH: 1.61 u[IU]/mL (ref 0.35–5.50)

## 2021-12-12 LAB — LDL CHOLESTEROL, DIRECT: Direct LDL: 154 mg/dL

## 2021-12-20 ENCOUNTER — Encounter: Payer: Medicare Other | Admitting: Gastroenterology

## 2021-12-20 NOTE — Telephone Encounter (Signed)
Spoke with patient, he is better now.

## 2021-12-28 ENCOUNTER — Encounter: Payer: Self-pay | Admitting: Gastroenterology

## 2022-01-03 ENCOUNTER — Telehealth: Payer: Self-pay | Admitting: Gastroenterology

## 2022-01-03 NOTE — Telephone Encounter (Signed)
Patient rescheduled for procedure due to having the flu. Will need updated prep instructions.

## 2022-01-04 ENCOUNTER — Encounter: Payer: Medicare Other | Admitting: Gastroenterology

## 2022-01-04 NOTE — Telephone Encounter (Signed)
New prep instructions sent via MyChart and pt made aware

## 2022-01-17 ENCOUNTER — Telehealth: Payer: Self-pay | Admitting: Gastroenterology

## 2022-01-17 NOTE — Telephone Encounter (Signed)
Inbound call from patient stating that he is scheduled to have a colonoscopy on 1/18 at 1:30 with Dr. [name] and has a dry cough. Patient is requesting a call back to discuss if he is still okay to have procedure. Please advise.

## 2022-01-17 NOTE — Telephone Encounter (Signed)
Called the patient back and answered a few questions regarding clear liquids and light meal acceptable the morning he starts his prep. Pt has a lingering dry cough that is intermittent after a short respiratory illness a few weeks ago. Advised him that if his symptoms worsen to let us know but he should be fine for his procedure Thursday.

## 2022-01-19 ENCOUNTER — Encounter: Payer: Self-pay | Admitting: Gastroenterology

## 2022-01-19 ENCOUNTER — Ambulatory Visit (AMBULATORY_SURGERY_CENTER): Payer: Medicare Other | Admitting: Gastroenterology

## 2022-01-19 VITALS — BP 120/68 | HR 76 | Temp 97.3°F | Resp 12 | Ht 66.75 in | Wt 188.0 lb

## 2022-01-19 DIAGNOSIS — Z86010 Personal history of colonic polyps: Secondary | ICD-10-CM

## 2022-01-19 DIAGNOSIS — K5730 Diverticulosis of large intestine without perforation or abscess without bleeding: Secondary | ICD-10-CM

## 2022-01-19 DIAGNOSIS — Z09 Encounter for follow-up examination after completed treatment for conditions other than malignant neoplasm: Secondary | ICD-10-CM | POA: Diagnosis not present

## 2022-01-19 DIAGNOSIS — D122 Benign neoplasm of ascending colon: Secondary | ICD-10-CM | POA: Diagnosis not present

## 2022-01-19 DIAGNOSIS — Z1211 Encounter for screening for malignant neoplasm of colon: Secondary | ICD-10-CM | POA: Diagnosis not present

## 2022-01-19 DIAGNOSIS — D123 Benign neoplasm of transverse colon: Secondary | ICD-10-CM

## 2022-01-19 DIAGNOSIS — K640 First degree hemorrhoids: Secondary | ICD-10-CM | POA: Diagnosis not present

## 2022-01-19 MED ORDER — SODIUM CHLORIDE 0.9 % IV SOLN: 500.0000 mL | Freq: Once | INTRAVENOUS | Status: DC

## 2022-01-19 NOTE — Progress Notes (Signed)
Pt's states no medical or surgical changes since previsit or office visit. 

## 2022-01-19 NOTE — Progress Notes (Signed)
History & Physical  Primary Care Physician:  Loralee Pacas, MD Primary Gastroenterologist: Lucio Edward, MD  CHIEF COMPLAINT:  Personal history of colon polyps   HPI: Darren Bryant is a 67 y.o. male with a personal history of adenomatous colon polyps for surveillance colonoscopy.   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 11/08/2021   [facility] and wears bifocals for bad reading and requiring distance vision correction   Overweight 11/08/2021   Mostly truncal   Tinnitus 11/08/2021   intermittent   Trigger finger of right hand 11/08/2021   Right pinky finger kind of has on even movement and flex through the proximal interphalangeal joint and a nonsmooth way   Vitamin D deficiency 11/08/2021   Reports he is taking vit d  since  Lab Results Component Value Date/Time  VD25OH 29.45 (L) 11/03/2019 03:18 PM      Past Surgical History:  Procedure Laterality Date   COLONOSCOPY  06/30/2008   TA   HERNIA REPAIR     KNEE SURGERY Bilateral     Prior to Admission medications   Medication Sig Start Date End Date Taking? Authorizing Provider  b complex vitamins tablet Take 1 tablet by mouth daily.   Yes [provider]  Cholecalciferol (D3 ADULT PO) Take by mouth daily.   Yes [provider]  ibuprofen (ADVIL) 400 MG tablet Take 400 mg by mouth every 6 (six) hours as needed.    [provider]     Current Outpatient Medications  Medication Sig Dispense Refill   b complex vitamins tablet Take 1 tablet by mouth daily.     Cholecalciferol (D3 ADULT PO) Take by mouth daily.     ibuprofen (ADVIL) 400 MG tablet Take 400 mg by mouth every 6 (six) hours as needed.     Current Facility-Administered Medications  Medication Dose Route Frequency Provider Last Rate Last Admin   0.9 %  sodium chloride infusion  500 mL Intravenous Once Ladene Artist, MD        Allergies as of 01/19/2022   (No Known Allergies)    Family History  Problem Relation Age of Onset   Heart disease Mother    Heart attack Mother    Hearing loss Father    Hypertension Father    Stroke Brother    Pancreatic cancer Brother    Heart disease Other    Stroke Other    Hyperlipidemia Other    Colon cancer Neg Hx    Colon polyps Neg Hx    Rectal cancer Neg Hx    Esophageal cancer Neg Hx    Stomach cancer Neg Hx     Social History   Socioeconomic History   Marital status: Legally Separated    Spouse name: Not on file   Number of children: Not on file   Years of education: COLLEGE   Highest education level:  Not on file  Occupational History   Occupation: SALES  Tobacco Use   Smoking status: Never   Smokeless tobacco: Never  Vaping Use   Vaping Use: Never used  Substance and Sexual Activity   Alcohol use: Yes    Alcohol/week: 10.0 standard drinks of alcohol    Types: 10 Standard drinks or equivalent per week    Comment: 10 drinks per week   Drug use: Never   Sexual activity: Yes  Other Topics Concern   Not on file  Social History Narrative   EXERCISE TWICE WEEKLY FOR 1 HOUR. Married. Education: [employer].   Social Determinants of Health   Financial Resource Strain: Not on file  Food Insecurity: Not on file  Transportation Needs: Not on file  Physical Activity: Not on file  Stress: Not on file  Social Connections: Not on file  Intimate Partner Violence: Not on file    Review of  Systems:  All systems reviewed were negative except where noted in HPI.   Physical Exam: Vital signs in last 24 hours: '@VSRANGES'$ @   General:  Alert, well-developed, in NAD Head:  Normocephalic and atraumatic. Eyes:  Sclera clear, no icterus.   Conjunctiva pink. Ears:  Normal auditory acuity. Mouth:  No deformity or lesions.  Neck:  Supple; no masses . Lungs:  Clear throughout to auscultation.   No wheezes, crackles, or rhonchi. No acute distress. Heart:  Regular rate and rhythm; no murmurs. Abdomen:  Soft, nondistended, nontender. No masses, hepatomegaly. No obvious masses.  Normal bowel .    Rectal:  Deferred   Msk:  Symmetrical without gross deformities.. Pulses:  Normal pulses noted. Extremities:  Without edema. Neurologic:  Alert and  oriented x4;  grossly normal neurologically. Skin:  Intact without significant lesions or rashes. Psych:  Alert and cooperative. Normal mood and affect.  Impression / Plan:   Personal history of adenomatous colon polyps for surveillance colonoscopy.  [name]. [name]  01/19/2022, 1:06 PM See Shea Evans, [facility], to contact our on call provider

## 2022-01-19 NOTE — Progress Notes (Signed)
Called to room to assist during endoscopic procedure.  Patient ID and intended procedure confirmed with present staff. Received instructions for my participation in the procedure from the performing physician.  

## 2022-01-19 NOTE — Patient Instructions (Signed)
Resume previous diet an medications. Awaiting pathology results. Repeat Colonoscopy date to be determined based on pathology. Handouts provided on Colon polyps, Diverticulosis and Hemorrhoids  YOU HAD AN ENDOSCOPIC PROCEDURE TODAY AT [facility]:   Refer to the procedure report that was given to you for any specific questions about what was found during the examination.  If the procedure report does not answer your questions, please call your gastroenterologist to clarify.  If you requested that your care partner not be given the details of your procedure findings, then the procedure report has been included in a sealed envelope for you to review at your convenience later.  YOU SHOULD EXPECT: Some feelings of bloating in the abdomen. Passage of more gas than usual.  Walking can help get rid of the air that was put into your GI tract during the procedure and reduce the bloating. If you had a lower endoscopy (such as a colonoscopy or flexible sigmoidoscopy) you may notice spotting of blood in your stool or on the toilet paper. If you underwent a bowel prep for your procedure, you may not have a normal bowel movement for a few days.  Please Note:  You might notice some irritation and congestion in your nose or some drainage.  This is from the oxygen used during your procedure.  There is no need for concern and it should clear up in a day or so.  SYMPTOMS TO REPORT IMMEDIATELY:  Following lower endoscopy (colonoscopy or flexible sigmoidoscopy):  Excessive amounts of blood in the stool  Significant tenderness or worsening of abdominal pains  Swelling of the abdomen that is new, acute  Fever of 100F or higher   For urgent or emergent issues, a gastroenterologist can be reached at any hour by calling [phone]. Do not use MyChart messaging for urgent concerns.    DIET:  We do recommend a small meal at first, but then you may proceed to your regular diet.  Drink plenty of fluids  but you should avoid alcoholic beverages for 24 hours.  ACTIVITY:  You should plan to take it easy for the rest of today and you should NOT DRIVE or use heavy machinery until tomorrow (because of the sedation medicines used during the test).    FOLLOW UP: Our staff will call the number listed on your records the next business day following your procedure.  We will call around 7:15- 8:00 am to check on you and address any questions or concerns that you may have regarding the information given to you following your procedure. If we do not reach you, we will leave a message.     If any biopsies were taken you will be contacted by phone or by letter within the next 1-3 weeks.  Please call us at [phone] if you have not heard about the biopsies in 3 weeks.    SIGNATURES/CONFIDENTIALITY: You and/or your care partner have signed paperwork which will be entered into your electronic medical record.  These signatures attest to the fact that that the information above on your After Visit Summary has been reviewed and is understood.  Full responsibility of the confidentiality of this discharge information lies with you and/or your care-partner.

## 2022-01-19 NOTE — Op Note (Signed)
[place] Patient Name: Darren Bryant Procedure Date: 01/19/2022 1:28 PM MRN: [id number] Endoscopist: Ladene Artist , MD, [id number] Age: 67 Referring MD:  Date of Birth: 1955/06/10 Gender: Male Account #: [id number] Procedure:                Colonoscopy Indications:              Surveillance: Personal history of adenomatous                            polyps on last colonoscopy > 5 years ago Medicines:                Monitored Anesthesia Care Procedure:                Pre-Anesthesia Assessment:                           - Prior to the procedure, a History and Physical                            was performed, and patient medications and                            allergies were reviewed. The patient's tolerance of                            previous anesthesia was also reviewed. The risks                            and benefits of the procedure and the sedation                            options and risks were discussed with the patient.                            All questions were answered, and informed consent                            was obtained. Prior Anticoagulants: The patient has                            taken no anticoagulant or antiplatelet agents. ASA                            Grade Assessment: II - A patient with mild systemic                            disease. After reviewing the risks and benefits,                            the patient was deemed in satisfactory condition to                            undergo the procedure.  After obtaining informed consent, the colonoscope                            was passed under direct vision. Throughout the                            procedure, the patient's blood pressure, pulse, and                            oxygen saturations were monitored continuously. The                            Olympus CF-HQ190L ([id number]) Colonoscope was                            introduced through the  anus and advanced to the the                            cecum, identified by appendiceal orifice and                            ileocecal valve. The ileocecal valve, appendiceal                            orifice, and rectum were photographed. The quality                            of the bowel preparation was good. The colonoscopy                            was performed without difficulty. The patient                            tolerated the procedure well. Scope In: 1:28:53 PM Scope Out: 1:45:13 PM Scope Withdrawal Time: 0 hours 12 minutes 55 seconds  Total Procedure Duration: 0 hours 16 minutes 20 seconds  Findings:                 The perianal and digital rectal examinations were                            normal.                           A 3 mm polyp was found in the ascending colon. The                            polyp was sessile. The polyp was removed with a                            cold biopsy forceps. Resection and retrieval were                            complete.  A 7 mm polyp was found in the transverse colon. The                            polyp was sessile. The polyp was removed with a                            cold snare. Resection and retrieval were complete.                           Multiple medium-mouthed diverticula were found in                            the sigmoid colon, descending colon and transverse                            colon. There was no evidence of diverticular                            bleeding.                           Internal hemorrhoids were found during                            retroflexion. The hemorrhoids were small and Grade                            I (internal hemorrhoids that do not prolapse).                           The exam was otherwise without abnormality on                            direct and retroflexion views. Complications:            No immediate complications. Estimated blood loss:                             None. Estimated Blood Loss:     Estimated blood loss: none. Impression:               - One 3 mm polyp in the ascending colon, removed                            with a cold biopsy forceps. Resected and retrieved.                           - One 7 mm polyp in the transverse colon, removed                            with a cold snare. Resected and retrieved.                           - Moderate diverticulosis.                           -  Internal hemorrhoids.                           - The examination was otherwise normal on direct                            and retroflexion views. Recommendation:           - Repeat colonoscopy after studies are complete for                            surveillance based on pathology results.                           - Patient has a contact number available for                            emergencies. The signs and symptoms of potential                            delayed complications were discussed with the                            patient. Return to normal activities tomorrow.                            Written discharge instructions were provided to the                            patient.                           - High fiber diet.                           - Continue present medications.                           - Await pathology results. Ladene Artist, MD 01/19/2022 1:50:27 PM This report has been signed electronically.

## 2022-01-19 NOTE — Progress Notes (Signed)
Report to PACU, RN, vss, BBS= Clear.  

## 2022-01-20 ENCOUNTER — Telehealth: Payer: Self-pay | Admitting: *Deleted

## 2022-01-20 NOTE — Telephone Encounter (Signed)
  Follow up Call-     01/19/2022   12:40 PM  Call back number  Post procedure Call Back phone  # [phone]  Permission to leave phone message Yes     Patient questions:  Do you have a fever, pain , or abdominal swelling? No. Pain Score  0 *  Have you tolerated food without any problems? Yes.    Have you been able to return to your normal activities? Yes.    Do you have any questions about your discharge instructions: Diet   No. Medications  No. Follow up visit  No.  Do you have questions or concerns about your Care? No.  Actions: * If pain score is 4 or above: No action needed, pain <4.

## 2022-01-25 DIAGNOSIS — K080 Exfoliation of teeth due to systemic causes: Secondary | ICD-10-CM | POA: Diagnosis not present

## 2022-02-02 ENCOUNTER — Encounter: Payer: Self-pay | Admitting: Gastroenterology

## 2022-02-08 ENCOUNTER — Encounter: Payer: Self-pay | Admitting: Internal Medicine

## 2022-02-08 DIAGNOSIS — Z860100 Personal history of colon polyps, unspecified: Secondary | ICD-10-CM | POA: Insufficient documentation

## 2022-04-25 DIAGNOSIS — K080 Exfoliation of teeth due to systemic causes: Secondary | ICD-10-CM | POA: Diagnosis not present

## 2022-06-08 ENCOUNTER — Encounter: Payer: Medicare Other | Admitting: Internal Medicine

## 2022-06-12 ENCOUNTER — Encounter: Payer: Medicare Other | Admitting: Internal Medicine

## 2022-06-27 ENCOUNTER — Encounter: Payer: Medicare Other | Admitting: Internal Medicine

## 2022-08-08 DIAGNOSIS — K080 Exfoliation of teeth due to systemic causes: Secondary | ICD-10-CM | POA: Diagnosis not present

## 2022-11-08 ENCOUNTER — Encounter: Payer: Self-pay | Admitting: Internal Medicine

## 2022-11-08 ENCOUNTER — Ambulatory Visit: Payer: Medicare Other | Admitting: Internal Medicine

## 2022-11-08 VITALS — BP 130/84 | HR 71 | Temp 98.0°F | Ht 66.75 in | Wt 178.2 lb

## 2022-11-08 DIAGNOSIS — J3089 Other allergic rhinitis: Secondary | ICD-10-CM | POA: Diagnosis not present

## 2022-11-08 DIAGNOSIS — Z8249 Family history of ischemic heart disease and other diseases of the circulatory system: Secondary | ICD-10-CM

## 2022-11-08 DIAGNOSIS — R030 Elevated blood-pressure reading, without diagnosis of hypertension: Secondary | ICD-10-CM

## 2022-11-08 DIAGNOSIS — E782 Mixed hyperlipidemia: Secondary | ICD-10-CM

## 2022-11-08 DIAGNOSIS — Z0001 Encounter for general adult medical examination with abnormal findings: Secondary | ICD-10-CM

## 2022-11-08 DIAGNOSIS — Z860100 Personal history of colon polyps, unspecified: Secondary | ICD-10-CM

## 2022-11-08 DIAGNOSIS — R972 Elevated prostate specific antigen [PSA]: Secondary | ICD-10-CM | POA: Diagnosis not present

## 2022-11-08 DIAGNOSIS — E8881 Metabolic syndrome: Secondary | ICD-10-CM | POA: Insufficient documentation

## 2022-11-08 DIAGNOSIS — E663 Overweight: Secondary | ICD-10-CM

## 2022-11-08 DIAGNOSIS — R7303 Prediabetes: Secondary | ICD-10-CM

## 2022-11-08 DIAGNOSIS — Z0000 Encounter for general adult medical examination without abnormal findings: Secondary | ICD-10-CM | POA: Insufficient documentation

## 2022-11-08 DIAGNOSIS — H9193 Unspecified hearing loss, bilateral: Secondary | ICD-10-CM | POA: Insufficient documentation

## 2022-11-08 DIAGNOSIS — H9319 Tinnitus, unspecified ear: Secondary | ICD-10-CM

## 2022-11-08 DIAGNOSIS — E559 Vitamin D deficiency, unspecified: Secondary | ICD-10-CM | POA: Diagnosis not present

## 2022-11-08 DIAGNOSIS — R21 Rash and other nonspecific skin eruption: Secondary | ICD-10-CM | POA: Insufficient documentation

## 2022-11-08 DIAGNOSIS — Z96652 Presence of left artificial knee joint: Secondary | ICD-10-CM

## 2022-11-08 DIAGNOSIS — H543 Unqualified visual loss, both eyes: Secondary | ICD-10-CM

## 2022-11-08 DIAGNOSIS — J309 Allergic rhinitis, unspecified: Secondary | ICD-10-CM | POA: Insufficient documentation

## 2022-11-08 LAB — LIPID PANEL
Cholesterol: 238 mg/dL — ABNORMAL HIGH (ref 0–200)
HDL: 51.2 mg/dL (ref >39.00)
LDL Cholesterol: 157 mg/dL — ABNORMAL HIGH (ref 0–99)
NonHDL: 186.46
Total CHOL/HDL Ratio: 5
Triglycerides: 149 mg/dL (ref 0.0–149.0)
VLDL: 29.8 mg/dL (ref 0.0–40.0)

## 2022-11-08 LAB — CBC WITH DIFFERENTIAL/PLATELET
Basophils Absolute: 0.1 10*3/uL (ref 0.0–0.1)
Basophils Relative: 1.3 % (ref 0.0–3.0)
Eosinophils Absolute: 0.1 10*3/uL (ref 0.0–0.7)
Eosinophils Relative: 1.8 % (ref 0.0–5.0)
HCT: 44.4 % (ref 39.0–52.0)
Hemoglobin: 14.7 g/dL (ref 13.0–17.0)
Lymphocytes Relative: 39.2 % (ref 12.0–46.0)
Lymphs Abs: 2.3 10*3/uL (ref 0.7–4.0)
MCHC: 33.1 g/dL (ref 30.0–36.0)
MCV: 87.7 fL (ref 78.0–100.0)
Monocytes Absolute: 0.4 10*3/uL (ref 0.1–1.0)
Monocytes Relative: 7.1 % (ref 3.0–12.0)
Neutro Abs: 2.9 10*3/uL (ref 1.4–7.7)
Neutrophils Relative %: 50.6 % (ref 43.0–77.0)
Platelets: 212 10*3/uL (ref 150.0–400.0)
RBC: 5.06 Mil/uL (ref 4.22–5.81)
RDW: 14 % (ref 11.5–15.5)
WBC: 5.8 10*3/uL (ref 4.0–10.5)

## 2022-11-08 LAB — COMPREHENSIVE METABOLIC PANEL
ALT: 16 U/L (ref 0–53)
AST: 16 U/L (ref 0–37)
Albumin: 4.4 g/dL (ref 3.5–5.2)
Alkaline Phosphatase: 49 U/L (ref 39–117)
BUN: 13 mg/dL (ref 6–23)
CO2: 25 meq/L (ref 19–32)
Calcium: 9.4 mg/dL (ref 8.4–10.5)
Chloride: 101 meq/L (ref 96–112)
Creatinine, Ser: 0.99 mg/dL (ref 0.40–1.50)
GFR: 78.99 mL/min (ref >60.00)
Glucose, Bld: 98 mg/dL (ref 70–99)
Potassium: 4.1 meq/L (ref 3.5–5.1)
Sodium: 135 meq/L (ref 135–145)
Total Bilirubin: 0.7 mg/dL (ref 0.2–1.2)
Total Protein: 6.8 g/dL (ref 6.0–8.3)

## 2022-11-08 LAB — TSH: TSH: 1.55 u[IU]/mL (ref 0.35–5.50)

## 2022-11-08 LAB — PSA: PSA: 5.73 ng/mL — ABNORMAL HIGH (ref 0.10–4.00)

## 2022-11-08 LAB — HEMOGLOBIN A1C: Hgb A1c MFr Bld: 6.2 % (ref 4.6–6.5)

## 2022-11-08 NOTE — Assessment & Plan Note (Signed)
He reported symptoms consistent with allergies. We discussed the benefits of sinus rinses and Flonase. We recommend sinus rinses and Flonase for allergy symptoms.

## 2022-11-08 NOTE — Assessment & Plan Note (Signed)
He has an unidentified pink rash on the side of the face. We discussed the possibility of it being fungal or an autoimmune condition. We will photograph the rash and refer him to dermatology.

## 2022-11-08 NOTE — Assessment & Plan Note (Signed)
We discussed EKG and CT coronary calcium score options for heart disease screening due to his family history of heart disease. He opted to wait for cholesterol results. We will order a cholesterol panel with today's labs.

## 2022-11-08 NOTE — Patient Instructions (Addendum)
VISIT SUMMARY:  During your annual wellness visit, we reviewed your proactive health management, including your weight loss and increased physical activity. We discussed your history of prediabetes, skin rash, and resolved trigger finger. We also reviewed your vitamin D deficiency, urinary health, and family history of heart disease and cancer. You reported no current health concerns or symptoms.  YOUR PLAN:  -PROSTATE CANCER SCREENING: Prostate cancer screening involves checking the prostate-specific antigen (PSA) levels in your blood to detect prostate cancer early. Your last PSA level increased from 2.4 to 3.59 over seven years. We will order a PSA test with today's labs to monitor your prostate health.  -VITAMIN D DEFICIENCY: Vitamin D deficiency can lead to bone loss and fatigue. You have a history of this condition but declined a vitamin D level check today. It's important to continue monitoring your vitamin D levels regularly.  -INFLUENZA VACCINATION: The influenza vaccination helps protect against the flu. You reported receiving your flu shot a few weeks ago. We will continue with annual flu vaccinations.  -SKIN RASH: You have an unidentified pink rash on the side of your face, which could be fungal or autoimmune. We will photograph the rash and refer you to dermatology for further evaluation.  -WEIGHT LOSS AND PREDIABETES: Prediabetes is a condition where blood sugar levels are higher than normal but not high enough to be classified as diabetes. Your weight loss and improved diet are positive steps. We will order an A1C test with today's labs to monitor your blood sugar levels.  -ALLERGIC RHINITIS: Allergic rhinitis is an allergic reaction that causes sneezing, congestion, and a runny nose. We recommend using sinus rinses and Flonase to help manage your allergy symptoms.  -CARDIOVASCULAR DISEASE SCREENING: Cardiovascular disease screening helps detect heart disease early. Given your family  history, we discussed EKG and CT coronary calcium score options. You opted to wait for cholesterol results, so we will order a cholesterol panel with today's labs.  -GENERAL HEALTH MAINTENANCE: Continue regular dental and eye exams. Use a snore lab app to monitor for sleep apnea, and continue using safety helmets and sunscreen when working outside. Consider over-the-counter antifungal treatment for your skin rash and a fish oil supplement for heart health.  INSTRUCTIONS:  We will order a PSA test, A1C test, and cholesterol panel with today's labs. We will also photograph your skin rash and refer you to dermatology for further evaluation.

## 2022-11-08 NOTE — Assessment & Plan Note (Addendum)
Declined / deferred / expressed a preference to not move forward with vitamin D retesting He has a history of vitamin D deficiency and understands the importance of regular checks and the risks of deficiency, including bone loss and fatigue. He declined a vitamin D level check for today.

## 2022-11-08 NOTE — Assessment & Plan Note (Signed)
He reported weight loss and an improved diet. We discussed the importance of maintaining a healthy diet and weight for heart health and diabetes prevention. We will order an A1C with today's labs to monitor for diabetes.

## 2022-11-08 NOTE — Assessment & Plan Note (Signed)
Lab Results  Component Value Date   HGBA1C 6.3 12/12/2021   HGBA1C 6.3 11/03/2019   HGBA1C 6.1 (H) 12/18/2014   retest

## 2022-11-08 NOTE — Assessment & Plan Note (Signed)
Considered and assessed without addressing in todays visit

## 2022-11-08 NOTE — Assessment & Plan Note (Signed)
He is doing better on diet

## 2022-11-08 NOTE — Assessment & Plan Note (Signed)
Lab Results  Component Value Date   PSA 3.59 11/03/2019   PSA 2.42 12/18/2014   PSA 2.28 11/25/2013  Last PSA in 2021 increased from 2.4 to 3.59 over seven years. We discussed the importance of regular PSA screening and prostate cancer risks. We will order PSA with today's labs.

## 2022-11-08 NOTE — Progress Notes (Signed)
[facility]: [phone]   -- Annual Preventive Medical Office Visit --  Patient:  Darren Bryant      Age: 67 y.o.       Sex:  male  Date:   11/08/2022 Patient Care Team: Lula Olszewski, MD as PCP - General (Internal Medicine) Today's Healthcare Provider: Lula Olszewski, MD  ========================================= Chief Complaint  Patient presents with   Annual Exam   Purpose of Visit: Comprehensive preventive health assessment and personalized health maintenance planning.  This encounter was conducted as a Comprehensive Physical Exam (CPE) preventive care annual visit. The patient's medical history and problem list were reviewed to inform individualized preventive care recommendations.   No problem-specific medical treatment was provided during this visit.    Assessment & Plan Encounter for annual health examination  Rising PSA level Lab Results  Component Value Date   PSA 3.59 11/03/2019   PSA 2.42 12/18/2014   PSA 2.28 11/25/2013  Last PSA in 2021 increased from 2.4 to 3.59 over seven years. We discussed the importance of regular PSA screening and prostate cancer risks. We will order PSA with today's labs. Vitamin D deficiency Declined / deferred / expressed a preference to not move forward with vitamin D retesting He has a history of vitamin D deficiency and understands the importance of regular checks and the risks of deficiency, including bone loss and fatigue. He declined a vitamin D level check for today. Tinnitus, unspecified laterality Considered and assessed without addressing in todays visit  Pre-diabetes Lab Results  Component Value Date   HGBA1C 6.3 12/12/2021   HGBA1C 6.3 11/03/2019   HGBA1C 6.1 (H) 12/18/2014   retest Overweight He reported weight loss and an improved diet. We discussed the importance of maintaining a healthy diet and weight for heart health and diabetes prevention. We will order an A1C with today's labs to  monitor for diabetes.  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 We discussed the possibility of it being fungal or an autoimmune condition. We will photograph the rash and refer him to dermatology.  Allergic rhinitis due to other allergic trigger, unspecified seasonality He reported symptoms consistent with allergies. We discussed the benefits of sinus rinses and Flonase. We recommend sinus rinses and Flonase for allergy symptoms. Metabolic syndrome We discussed EKG and CT coronary calcium score options for heart disease screening due to his family history of heart disease. He opted to wait for cholesterol results. We will order a cholesterol panel with today's labs.  Family history of arteriosclerotic cardiovascular disease We discussed EKG and CT coronary calcium score options for heart disease screening due to his family history of heart disease. He opted to wait for cholesterol results. We will order a cholesterol panel with today's labs.  He should continue regular dental and eye exams. We suggest using a snore lab app to monitor for sleep apnea and continue using safety helmets and sunscreen when working outside. We consider over-the-counter antifungal treatment for the skin rash and recommend a fish oil supplement for heart health.  He reported receiving a flu shot a few weeks ago. We will continue annual influenza vaccinations.  Diagnoses and all orders for this visit: Encounter for annual health examination -     CBC w/Diff -     Comp Met (CMET) -     Lipid panel -     TSH -     PSA Rising PSA level -     CBC w/Diff -      Comp Met (CMET) -     Lipid panel -     TSH -     PSA Vitamin D deficiency Tinnitus, unspecified laterality Pre-diabetes -     CBC w/Diff -     Comp Met (CMET) -     Lipid panel -     TSH -     PSA -     HgB A1c Overweight Moderate mixed hyperlipidemia not requiring statin therapy Hx of colonic polyp H/O total knee replacement, left Elevated prostate specific antigen (PSA) Elevated blood pressure reading 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  He reports his last eye exam was:  he does and doing another   Dental health:  He reports he has been keeping up with his dental visits.  He intends to do so in the future.  We encourage regular tooth brushing/flossing. Sinus health:  We encouraged sterile saline nasal misting sinus rinses daily for pollen, to reduce allergies and risk for sinus infections.   Sterile can based misting products are recommended due to superior misting and ease of maintaining sterility Sleep Apnea screening:  We encourage self monitoring of sleep quality with SnoreLab App and other tools/apps that are now available at retail []  Do you snore loudly doesn't know about this []  Tired, fatigued, or sleepy during the daytime []  Witness apneas []  Significant hypertension []  BMI greater than 35    [x]  Age older than 67 years old [x]  Has large neck size over 15.7 in [x]  Male Total Score: SnoreLab App  strongly advised based on risk factors but he defers/declines recommendations today, and has full decision making capacity sleep evaluation.  Cardiovascular Risk Factor Reduction:   Advised patient  of need for regular exercise and diet rich and fruits and vegetables and healthy fats to reduce risk of heart attack and stroke.  Avoid first- and second-hand smoke and stimulants.   Avoid extreme exercise- exercise in moderation (150 minutes per week is a good goal) Discussed health benefits of physical activity, and encouraged him to engage in regular exercise appropriate for his age and condition. Exercise Activities and Dietary recommendations  Goals   None    Wt Readings from Last 3 Encounters:  11/08/22 178 lb 3.2 oz (80.8 kg)  01/19/22 188 lb (85.3 kg)  12/07/21 185 lb 6.4 oz (84.1 kg)  Body mass index is 28.12 kg/m. Health maintenance and immunizations reviewed and he was encouraged to complete anything that is due: Immunization History  Administered Date(s) Administered   Fluad Quad(high Dose 65+) 11/08/2021   Fluad Trivalent(High Dose 65+) 10/19/2022   Influenza Split 10/25/2012   Influenza, Seasonal, Injecte, Preservative Fre 12/07/2011   Influenza,inj,Quad PF,6+ Mos 10/25/2012, 11/25/2013, 10/29/2014, 12/04/2015, 11/03/2019   Influenza,inj,quad, With Preservative 01/02/2017   Moderna Sars-Covid-2 Vaccination 03/03/2019, 04/03/2019   PFIZER(Purple Top)SARS-COV-2 Vaccination 03/27/2019, 04/21/2019  Tdap 05/13/2008   Zoster Recombinant(Shingrix) 08/27/2021, 02/01/2022   Health Maintenance Due  Topic Date Due   Medicare Annual Wellness (AWV)  12/08/2022    Health Maintenance  Topic Date Due   Medicare Annual Wellness (AWV)  12/08/2022   Pneumonia Vaccine 46+ Years old (1 of 1 - PCV) 11/09/2022 (Originally 08/23/2020)   COVID-19 Vaccine (5 - 2023-24 season) 01/02/2023 (Originally 09/03/2022)   DTaP/Tdap/Td (2 - Td or Tdap) 11/08/2023 (Originally 05/14/2018)   Colonoscopy  01/19/2029   INFLUENZA VACCINE  Completed   Hepatitis C Screening  Completed   Zoster Vaccines- Shingrix  Completed   HPV VACCINES  Aged Out    Lifestyle / Social History Reviewed:  Social History    Tobacco Use   Smoking status: Never   Smokeless tobacco: Never  Vaping Use   Vaping status: Never Used  Substance Use Topics   Alcohol use: Yes    Alcohol/week: 6.0 standard drinks of alcohol    Types: 2 Glasses of wine, 4 Cans of beer per week    Comment: 10 drinks per week   Drug use: Never    My recommendation is total abstinence from all substances of abuse including smoke and 2nd hand smoke, alcohol, illicit drugs, smoking, inhalants, sugar, high risk sexual behavior Offered to assist with any use disorders or addictions.  Social History   Substance and Sexual Activity  Sexual Activity Not Currently   Birth control/protection: None  .      11/08/2022    9:31 AM  Depression screen PHQ 2/9  Decreased Interest 0  Down, Depressed, Hopeless 0  PHQ - 2 Score 0  Altered sleeping 1  Tired, decreased energy 0  Change in appetite 0  Feeling bad or failure about yourself  0  Trouble concentrating 0  Moving slowly or fidgety/restless 0  Suicidal thoughts 0  PHQ-9 Score 1  Difficult doing work/chores Not difficult at all   Injury prevention: Discussed safety belts, safety helmets, not texting and driving.  Today's Cancer Screening  Penile/Testicle/Scrotum cancer screening: Asked the patient about genital warts or tumors/abnormalities of penis/testicles/scrotum, encouraged patient to to inform me of any. Patient reports there are none. Thyroid cancer screening: patient advised to check by palpating thyroid for nodules  Prostate cancer screening:  Denies family history of prostate cancer or hematospermia so too young for screening by current guidelines.( Lab Results  Component Value Date   PSA 3.59 11/03/2019   PSA 2.42 12/18/2014   PSA 2.28 11/25/2013  ) Colon cancer screening:    UpToDate   Lung cancer screening: never smoker Skin cancer screening-  Advised regular sunscreen use. He denies worrisome, changing, or new skin lesions. Showed him pictures of melanomas for  reference:  Return to care in 1 year for next preventative visit.      Subjective  67 y.o. male presents today for a complete physical exam.  He reports consuming a general diet. Home exercise routine includes working outside more . He generally feels well. He reports sleeping well. He does not have additional problems to discuss today.  HPI  AI-Extracted: Discussed the use of AI scribe software for clinical note transcription with the patient, who gave verbal consent to proceed.  History of Present Illness   The patient, with a history of prediabetes and a family history of heart disease and cancer, presents for an annual wellness visit. He reports no current health concerns or symptoms. He has been proactive in managing his health, including weight loss  through improved diet and increased physical activity. He has noticed a significant reduction in waist size and has lost 10 pounds since his last visit.  The patient has a history of a skin rash on the face, which has been present for a couple of years. He reports no changes in size or symptoms associated with the rash. He has been monitoring it closely and has noticed no growth or changes in dimensions.  The patient also reports a history of trigger finger in both hands, which has since resolved. He suspects it may have been due to tendon inflammation from physical work.  In terms of urinary health, the patient reports no issues. He has a history of vitamin D deficiency but has been taking supplements and has not noticed any symptoms related to this condition.  The patient has been diligent in attending regular eye exams and dental check-ups. He reports no issues with vision or oral health.  The patient denies any history of smoking and reports no risk factors for cancer. He has been vigilant in self-checking for any lumps or bumps and reports no findings.  The patient has been proactive in receiving vaccinations, including the flu and  shingles vaccines. He has also been diligent in using sunscreen when working outside to protect against skin cancer.  The patient has a family history of heart disease and cancer. He has been mindful of his heart health, particularly given his prediabetes diagnosis. He has made significant changes to his diet, including reducing junk food and increasing his intake of vegetables and salads. He has also increased his physical activity, including weight training and cardio exercises.  The patient has a family history of pancreatic cancer and kidney cancer. He reports no symptoms or concerns related to these conditions. He has been vigilant in monitoring his health and has been proactive in managing his risk factors.      ROS A comprehensive ROS was negative for any concerning symptoms.  Disclaimer about ROS at Annual Preventive Visits Patients are informed before the Review of Systems (ROS) that identifying significant medical issues during the wellness visit may require immediate attention, potentially resulting in a separate billable encounter beyond the scope of the preventive exam. This disclosure is mandated by professional ethics and legal obligations, as healthcare providers must address any substantial health concerns raised during any patient interaction.  A comprehensive ROS is required by insurance companies for billing the visit. However, this structure may inadvertently discourage patients from fully disclosing health concerns due to potential financial implications. Consequently, patients often emphasize that any positive ROS findings are related to stable chronic conditions, requesting that these not be discussed during the preventive visit to avoid additional charges. Patients may also ask that reported complaints not be listed in the ROS to prevent affecting billing.  Problem list overviews that were updated at today's visit: Problem  Bilateral Hearing Loss  Encounter for Annual Health  Examination  Skin Rash  Allergic Rhinitis  Metabolic Syndrome  Family History of Arteriosclerotic Cardiovascular Disease  History of Colon Polyps   01/19/22 Surgical [P], colon, transverse and ascending, polyp (2) TUBULAR ADENOMA BENIGN LYMPHOID AGGREGATE NEGATIVE FOR HIGH-GRADE DYSPLASIA AND CARCINOMA   Rising Psa Level   Lab Results  Component Value Date   PSA 3.59 11/03/2019   PSA 2.42 12/18/2014   PSA 2.28 11/25/2013   Denies urine symptoms but he does have a PSA rising as shown No fh prostate cancer.   Vitamin D Deficiency   Reports he  is taking vit d  since  Lab Results  Component Value Date/Time   VD25OH 29.45 (L) 11/03/2019 03:18 PM       I attest that I have reviewed and confirmed the patients current medications to meet the medication reconciliation requirement no meds Current Outpatient Medications on File Prior to Visit  Medication Sig   b complex vitamins tablet Take 1 tablet by mouth daily.   Cholecalciferol (D3 ADULT PO) Take by mouth daily.   No current facility-administered medications on file prior to visit.  Suggested adding omega 3 supplement    Medications Discontinued During This Encounter  Medication Reason   ibuprofen (ADVIL) 400 MG tablet Patient Preference   Outpatient Medications Prior to Visit  Medication Sig   b complex vitamins tablet Take 1 tablet by mouth daily.   Cholecalciferol (D3 ADULT PO) Take by mouth daily.   [DISCONTINUED] ibuprofen (ADVIL) 400 MG tablet Take 400 mg by mouth every 6 (six) hours as needed.   No facility-administered medications prior to visit.  Review of Systems The following were reviewed and entered/updated into our electronic MEDICAL RECORD NUMBERPast Medical History:  Diagnosis Date   Allergy    Elevated blood pressure reading 11/08/2021   BP Readings from Last 3 Encounters: 11/08/21 (!) 146/80 11/03/19 (!) 148/80 02/15/17 138/74     Elevated prostate specific antigen (PSA) 11/08/2021   Lab  Results Component Value Date  PSA 3.59 11/03/2019  PSA 2.42 12/18/2014  PSA 2.28 11/25/2013  Denies urine symptoms but he does have a PSA rising as shown   H/O total knee replacement, 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 11/08/2021   [facility] and wears bifocals for bad reading and requiring distance vision correction   Overweight 11/08/2021   Mostly truncal   Tinnitus 11/08/2021   intermittent   Trigger finger of right hand 11/08/2021   Right pinky finger kind of has on even movement and flex through the proximal interphalangeal joint and a nonsmooth way   Vitamin D deficiency 11/08/2021   Reports he is taking vit d  since  Lab Results Component Value Date/Time  VD25OH 29.45 (L) 11/03/2019 03:18 PM     Past Surgical History:  Procedure Laterality Date   COLONOSCOPY  06/30/2008   TA   HERNIA REPAIR     JOINT REPLACEMENT  2022   Knee   KNEE SURGERY Bilateral    Social History   Socioeconomic History   Marital status: Divorced    Spouse name: Not on file   Number of children: Not on file   Years of education: COLLEGE   Highest education level: Not on file  Occupational History   Occupation: SALES  Tobacco Use   Smoking status: Never   Smokeless tobacco: Never  Vaping Use   Vaping status: Never Used  Substance and Sexual Activity   Alcohol use: Yes    Alcohol/week: 6.0 standard drinks of alcohol    Types: 2 Glasses of wine, 4 Cans of beer per week    Comment: 10 drinks per week   Drug use: Never   Sexual activity: Not Currently    Birth control/protection: None  Other Topics Concern   Not on file  Social History Narrative   EXERCISE TWICE WEEKLY FOR 1 HOUR. Married. Education: [employer].   Social Determinants of Health   Financial Resource Strain: Not on file  Food Insecurity: Not on file  Transportation Needs: Not on file  Physical Activity:  Not on file  Stress: Not on file  Social Connections: Not on file  Intimate Partner Violence: Not on file   Family Status  Relation Name Status   Mother Eswin Worrell Deceased at age 81       heart   Father Milus Fritze Deceased at age 40   Brother Onalee Hua Alive   Brother Maisie Fus Alive   Brother [employer] Deceased   Brother  Alive   MGM  Deceased   MGF  Deceased   Daughter  Alive   Son  Alive   Other  (Not Specified)   Other  (Not Specified)   Other  (Not Specified)   Neg Hx  (Not Specified)  No partnership data on file   Family History  Problem Relation Age of Onset   Heart disease Mother    Heart attack Mother    Hearing loss Father    Hypertension Father    Stroke Brother    Pancreatic cancer Brother    Heart disease Other    Stroke Other    Hyperlipidemia Other    Colon cancer Neg Hx    Colon polyps Neg Hx    Rectal cancer Neg Hx    Esophageal cancer Neg Hx    Stomach cancer Neg Hx   No Known Allergies Patient Care Team: Lula Olszewski, MD as PCP - General (Internal Medicine)      Objective  BP 130/84 (BP Location: Right Arm, Patient Position: Sitting)   Pulse 71   Temp 98 F (36.7 C) (Temporal)   Ht 5' 6.75" (1.695 m)   Wt 178 lb 3.2 oz (80.8 kg)   SpO2 97%   BMI 28.12 kg/m  BP Readings from Last 3 Encounters:  11/08/22 130/84  01/19/22 120/68  12/07/21 123/68   Wt Readings from Last 3 Encounters:  11/08/22 178 lb 3.2 oz (80.8 kg)  01/19/22 188 lb (85.3 kg)  12/07/21 185 lb 6.4 oz (84.1 kg)      Physical Exam  Physical Exam   MEASUREMENTS: WT- 185-188 HEENT: Eyes with watery indication of allergies. Nasal mucosa with signs of allergies. Throat with multiple dental fillings, no abnormalities. NECK: No palpable lymph nodes or masses. ABDOMEN: No abnormalities on palpation. SKIN: Rash on the side of the face, pinkish, non-itchy.    GEN: NAD, Resting Comfortably. HEENT: Tympanic membranes normal appearing bilaterally, Oropharynx clear, No  Thyromegaly noted. No palpable lymphadenopathy or thyroid nodules. CARDIOVASCULAR: S1 and S2 heart sounds have regular rate and rhythm with no murmurs appreciated. PULMONARY:  Normal work of breathing. Clear to auscultation bilaterally with no crackles, wheezes, or rhonchi. ABDOMEN: Soft, Nontender, Nondistended.  MSK: No edema, cyanosis, or clubbing noted. SKIN: Warm, dry, no lesions of concern observed. NEURO: CN2-12 grossly intact. Strength 5/5 in upper and lower extremities. Reflexes symmetric and intact bilaterally.  PSYCH: Normal affect and thought content, pleasant and cooperative. Last depression screening scores    11/08/2022    9:31 AM 11/08/2021    9:05 AM 11/03/2019    4:33 PM  PHQ 2/9 Scores  PHQ - 2 Score 0 0 0  PHQ- 9 Score 1  0   Last fall risk screening    11/08/2022    9:30 AM  Fall Risk   Falls in the past year? 0  Number falls in past yr: 0  Injury with Fall? 0  Risk for fall due to : No  Fall Risks  Follow up Falls evaluation completed   Last Audit-C alcohol use screening     No data to display         A score of 3 or more in women, and 4 or more in men indicates increased risk for alcohol abuse, EXCEPT if all of the points are from question 1  Last CBC Lab Results  Component Value Date   WBC 6.4 12/12/2021   HGB 15.2 12/12/2021   HCT 44.6 12/12/2021   MCV 85.7 12/12/2021   MCH 28.8 12/18/2014   RDW 14.5 12/12/2021   PLT 211.0 12/12/2021   Last metabolic panel Lab Results  Component Value Date   GLUCOSE 107 (H) 12/12/2021   NA 137 12/12/2021   K 4.4 12/12/2021   CL 102 12/12/2021   CO2 27 12/12/2021   BUN 16 12/12/2021   CREATININE 1.10 12/12/2021   GFR 70.05 12/12/2021   CALCIUM 9.4 12/12/2021   PROT 6.7 12/12/2021   ALBUMIN 4.3 12/12/2021   BILITOT 0.5 12/12/2021   ALKPHOS 45 12/12/2021   AST 13 12/12/2021   ALT 15 12/12/2021   Last lipids Lab Results  Component Value Date   CHOL 232 (H) 12/12/2021   HDL 46.90 12/12/2021    LDLCALC 152 (H) 11/03/2019   LDLDIRECT 154.0 12/12/2021   TRIG 227.0 (H) 12/12/2021   CHOLHDL 5 12/12/2021   Last hemoglobin A1c Lab Results  Component Value Date   HGBA1C 6.3 12/12/2021   Last thyroid functions Lab Results  Component Value Date   TSH 1.61 12/12/2021   Last vitamin D Lab Results  Component Value Date   VD25OH 32.13 12/12/2021   Last vitamin B12 and Folate No results found for: "OACZYSAY30", "FOLATE"      Lula Olszewski, MD  [facility] [phone] (phone) [phone] (fax) [facility]

## 2022-11-10 ENCOUNTER — Encounter: Payer: Self-pay | Admitting: Internal Medicine

## 2022-11-10 NOTE — Telephone Encounter (Signed)
Patient's review of lab results/notes confirmed. Sent my chart message reminding patient to schedule a follow-up visit in two to three weeks.

## 2022-11-23 DIAGNOSIS — H524 Presbyopia: Secondary | ICD-10-CM | POA: Diagnosis not present

## 2022-12-06 DIAGNOSIS — K080 Exfoliation of teeth due to systemic causes: Secondary | ICD-10-CM | POA: Diagnosis not present

## 2023-03-01 DIAGNOSIS — K080 Exfoliation of teeth due to systemic causes: Secondary | ICD-10-CM | POA: Diagnosis not present

## 2023-03-08 DIAGNOSIS — K080 Exfoliation of teeth due to systemic causes: Secondary | ICD-10-CM | POA: Diagnosis not present

## 2023-04-12 DIAGNOSIS — K080 Exfoliation of teeth due to systemic causes: Secondary | ICD-10-CM | POA: Diagnosis not present

## 2023-04-18 DIAGNOSIS — K080 Exfoliation of teeth due to systemic causes: Secondary | ICD-10-CM | POA: Diagnosis not present

## 2023-07-13 ENCOUNTER — Ambulatory Visit: Admitting: Internal Medicine

## 2023-07-20 DIAGNOSIS — H40013 Open angle with borderline findings, low risk, bilateral: Secondary | ICD-10-CM | POA: Diagnosis not present

## 2023-08-30 ENCOUNTER — Encounter: Payer: Self-pay | Admitting: Internal Medicine

## 2023-08-30 ENCOUNTER — Ambulatory Visit: Payer: Self-pay | Admitting: Internal Medicine

## 2023-08-30 ENCOUNTER — Ambulatory Visit (INDEPENDENT_AMBULATORY_CARE_PROVIDER_SITE_OTHER): Admitting: Internal Medicine

## 2023-08-30 VITALS — BP 138/88 | HR 68 | Temp 98.0°F | Ht 66.75 in | Wt 187.4 lb

## 2023-08-30 DIAGNOSIS — H1013 Acute atopic conjunctivitis, bilateral: Secondary | ICD-10-CM | POA: Diagnosis not present

## 2023-08-30 DIAGNOSIS — J324 Chronic pansinusitis: Secondary | ICD-10-CM | POA: Diagnosis not present

## 2023-08-30 DIAGNOSIS — R972 Elevated prostate specific antigen [PSA]: Secondary | ICD-10-CM

## 2023-08-30 DIAGNOSIS — R058 Other specified cough: Secondary | ICD-10-CM

## 2023-08-30 LAB — PSA: PSA: 5.1 ng/mL — ABNORMAL HIGH (ref 0.10–4.00)

## 2023-08-30 LAB — POC COVID19 BINAXNOW: SARS Coronavirus 2 Ag: NEGATIVE

## 2023-08-30 MED ORDER — PSEUDOEPHEDRINE HCL ER 120 MG PO TB12: 120.0000 mg | ORAL_TABLET | Freq: Two times a day (BID) | ORAL | 0 refills | Status: DC

## 2023-08-30 MED ORDER — LORATADINE 10 MG PO TABS: 10.0000 mg | ORAL_TABLET | Freq: Every day | ORAL | 11 refills | Status: DC

## 2023-08-30 MED ORDER — CROMOLYN SODIUM 4 % OP SOLN: 1.0000 [drp] | Freq: Four times a day (QID) | OPHTHALMIC | 0 refills | Status: DC

## 2023-08-30 MED ORDER — SIMPLY SALINE 0.9 % NA AERS: 2.0000 | INHALATION_SPRAY | NASAL | 11 refills | Status: DC

## 2023-08-30 MED ORDER — FLUTICASONE PROPIONATE 50 MCG/ACT NA SUSP: 2.0000 | Freq: Every day | NASAL | 6 refills | Status: DC

## 2023-08-30 MED ORDER — DOXYCYCLINE HYCLATE 100 MG PO TABS
100.0000 mg | ORAL_TABLET | Freq: Two times a day (BID) | ORAL | 0 refills | Status: DC
Start: 2023-08-30 — End: 2023-10-15

## 2023-08-30 NOTE — Progress Notes (Signed)
 Arrange 6 months PSA repeat.

## 2023-08-30 NOTE — Assessment & Plan Note (Signed)
 Elevated prostate specific antigen (PSA)   Previous PSA levels were rising. Current PSA level to be checked today. If PSA continues to rise, referral to urology for further evaluation, including potential biopsy, may be necessary. He is aware of the potential need for further investigation. Discussed that prostate cancer is slow-growing, and a few months delay in testing is not critical. Order PSA test today. Discuss potential referral to urology if PSA levels continue to rise. Inform about the possibility of a prostate biopsy if indicated by PSA results.

## 2023-08-30 NOTE — Progress Notes (Signed)
 ==============================  [facility]: [id number]   -- Medical Office Visit --  Patient: Darren Bryant      Age: 68 y.o.       Sex:  male  Date:   08/30/2023 Today's Healthcare Provider: Bernardino KANDICE Cone, MD  ==============================   Chief Complaint: Cough (Pt has had dry cough for over week now ) and Sore Throat (Sore throat due to coughing )  Discussed the use of AI scribe software for clinical note transcription with the patient, who gave verbal consent to proceed.  History of Present Illness  68 year old male who presents with a persistent cough and scratchy throat for two weeks.  He has been experiencing a persistent, non-productive cough and scratchy throat for the past two weeks. The cough feels as though he is trying to 'break something' in his upper chest. He notes a hoarse voice and a tickle in his throat before coughing. No body aches, fever, headache, nasal congestion, or sneezing. He denies shortness of breath or chest pain.  He has a history of bronchitis, which typically affects his bronchial tubes when he gets colds. He has not had any sick contacts, and his COVID test was negative.  His eyes have been watering more frequently, especially in the past week, which he associates with the cough. He has not changed his eating habits. His sleep is sometimes disrupted by the cough, waking up more often than usual.  He works in [employer] and travels frequently, staying in hotels during the week and at home on weekends. He engages in outdoor activities such as cutting grass and trimming bushes. He denies any history of asthma and does not smoke. He has previously had mononucleosis, which caused significant fatigue, but he does not feel fatigued currently. 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 Past Surgical History:   has a past surgical history that includes Hernia repair; Knee surgery (Bilateral); Colonoscopy (06/30/2008); and Joint replacement (2022). Social History:   reports that he has never smoked. He has never used smokeless tobacco. He reports current alcohol use of about 6.0 standard drinks of alcohol per week. He reports that he does not use drugs. Family History:  family history includes Hearing loss in his father; Heart attack in his mother; Heart disease in his mother and another family member; Hyperlipidemia in an other family member; Hypertension in his father; Pancreatic cancer in his brother; Stroke in his brother and another family member. Allergies:  has no known allergies.   Medication Reconciliation: Current Outpatient Medications on File Prior to Visit  Medication Sig   b complex vitamins tablet Take 1 tablet by mouth daily.   Cholecalciferol (D3 ADULT PO) Take by mouth daily.   No current facility-administered medications on file prior to visit.  There are no discontinued medications.   Physical Exam:    08/30/2023  11:38 AM 11/08/2022    9:35 AM 11/08/2022    9:17 AM  Vitals with BMI  Height 5' 6.75  5' 6.75  Weight 187 lbs 6 oz  178 lbs 3 oz  BMI 29.59  28.13  Systolic  138 130 148  Diastolic 88 84 90  Pulse 68  71  Vital signs reviewed.  Nursing notes reviewed. Weight trend reviewed. Physical Activity: Insufficiently Active (08/29/2023)   Exercise Vital Sign    Days of Exercise per Week: 3 days    Minutes of Exercise per Session: 30 min   General Appearance:  No acute distress appreciable.   Well-groomed, healthy-appearing male.  Well proportioned with no abnormal fat distribution.  Good muscle tone. Pulmonary:  Normal work of breathing at rest, no respiratory distress apparent. SpO2: 98 %  Musculoskeletal: All extremities are intact.  Neurological:  Awake, alert, oriented, and engaged.  No obvious focal neurological deficits or cognitive impairments.  Sensorium seems unclouded.   Speech is clear and coherent with logical content. Psychiatric:  Appropriate mood, pleasant and cooperative demeanor, thoughtful and engaged during the exam Frequent dry cough  Verbalized to patient: Physical Exam HEENT: Enlarged tonsils with mucus, no sore throat. CHEST: Clear to auscultation with whistling sound over main stem bronchi.   Results:    11/08/2022    9:31 AM 11/08/2021    9:05 AM 11/03/2019    4:33 PM 02/15/2017    4:28 PM  PHQ 2/9 Scores  PHQ - 2 Score 0 0 0 0  PHQ- 9 Score 1  0     Verbalized to patient: Results LABS COVID-19 test: negative    Results for orders placed or performed in visit on 08/30/23  PSA  Result Value Ref Range   PSA 5.10 (H) 0.10 - 4.00 ng/mL  POC COVID-19 BinaxNow  Result Value Ref Range   SARS Coronavirus 2 Ag Negative Negative   Office Visit on 08/30/2023  Component Date Value Ref Range Status   SARS Coronavirus 2 Ag 08/30/2023 Negative  Negative Final   PSA 08/30/2023 5.10 (H)  0.10 - 4.00 ng/mL Final  Office Visit on 11/08/2022  Component Date Value Ref Range Status   WBC 11/08/2022 5.8  4.0 - 10.5 K/uL Final   RBC 11/08/2022 5.06  4.22 - 5.81 Mil/uL Final   Hemoglobin 11/08/2022 14.7  13.0 - 17.0 g/dL Final    HCT 88/93/7975 44.4  39.0 - 52.0 % Final   MCV 11/08/2022 87.7  78.0 - 100.0 fl Final   MCHC 11/08/2022 33.1  30.0 - 36.0 g/dL Final   RDW 88/93/7975 14.0  11.5 - 15.5 % Final   Platelets 11/08/2022 212.0  150.0 - 400.0 K/uL Final   Neutrophils Relative % 11/08/2022 50.6  43.0 - 77.0 % Final   Lymphocytes Relative 11/08/2022 39.2  12.0 - 46.0 % Final   Monocytes Relative 11/08/2022 7.1  3.0 - 12.0 % Final   Eosinophils Relative 11/08/2022 1.8  0.0 - 5.0 % Final   Basophils Relative 11/08/2022 1.3  0.0 - 3.0 % Final   Neutro Abs 11/08/2022 2.9  1.4 - 7.7 K/uL Final   Lymphs Abs 11/08/2022 2.3  0.7 - 4.0 K/uL Final   Monocytes Absolute 11/08/2022 0.4  0.1 - 1.0 K/uL Final   Eosinophils Absolute 11/08/2022 0.1  0.0 - 0.7 K/uL Final   Basophils Absolute 11/08/2022 0.1  0.0 - 0.1 K/uL Final   Sodium 11/08/2022 135  135 - 145 mEq/L Final   Potassium 11/08/2022 4.1  3.5 - 5.1 mEq/L Final   Chloride 11/08/2022 101  96 - 112 mEq/L Final   CO2 11/08/2022 25  19 - 32 mEq/L Final   Glucose, Bld 11/08/2022 98  70 - 99 mg/dL Final   BUN 88/93/7975 13  6 - 23 mg/dL Final   Creatinine, Ser 11/08/2022 0.99  0.40 - 1.50 mg/dL Final   Total Bilirubin 11/08/2022 0.7  0.2 - 1.2 mg/dL Final   Alkaline Phosphatase 11/08/2022 49  39 - 117 U/L Final   AST 11/08/2022 16  0 - 37 U/L Final   ALT 11/08/2022 16  0 - 53 U/L Final   Total Protein 11/08/2022 6.8  6.0 - 8.3 g/dL Final   Albumin 88/93/7975 4.4  3.5 - 5.2 g/dL Final   GFR 88/93/7975 78.99  >60.00 mL/min Final   Calcium  11/08/2022 9.4  8.4 - 10.5 mg/dL Final   Cholesterol 88/93/7975 238 (H)  0 - 200 mg/dL Final   Triglycerides 88/93/7975 149.0  0.0 - 149.0 mg/dL Final   HDL 88/93/7975 51.20  >39.00 mg/dL Final   VLDL 88/93/7975 29.8  0.0 - 40.0 mg/dL Final   LDL Cholesterol 11/08/2022 157 (H)  0 - 99 mg/dL Final   Total CHOL/HDL Ratio 11/08/2022 5   Final   NonHDL 11/08/2022 186.46   Final   TSH 11/08/2022 1.55  0.35 - 5.50 uIU/mL Final   PSA  11/08/2022 5.73 (H)  0.10 - 4.00 ng/mL Final   Hgb A1c MFr Bld 11/08/2022 6.2  4.6 - 6.5 % Final  Lab on 12/12/2021  Component Date Value Ref Range Status   VITD 12/12/2021 32.13  30.00 - 100.00 ng/mL Final   Cholesterol 12/12/2021 232 (H)  0 - 200 mg/dL Final   Triglycerides 87/88/7976 227.0 (H)  0.0 - 149.0 mg/dL Final   HDL 87/88/7976 46.90  >39.00 mg/dL Final   VLDL 87/88/7976 45.4 (H)  0.0 - 40.0 mg/dL Final   Total CHOL/HDL Ratio 12/12/2021 5   Final   NonHDL 12/12/2021 184.79   Final   Hgb A1c MFr Bld 12/12/2021 6.3  4.6 - 6.5 % Final   TSH 12/12/2021 1.61  0.35 - 5.50 uIU/mL Final   Sodium 12/12/2021 137  135 - 145 mEq/L Final   Potassium 12/12/2021 4.4  3.5 - 5.1 mEq/L Final   Chloride 12/12/2021 102  96 - 112 mEq/L Final   CO2 12/12/2021 27  19 - 32 mEq/L Final   Glucose, Bld 12/12/2021 107 (H)  70 - 99 mg/dL Final   BUN 87/88/7976 16  6 - 23 mg/dL Final   Creatinine, Ser 12/12/2021 1.10  0.40 - 1.50 mg/dL Final   Total Bilirubin 12/12/2021 0.5  0.2 - 1.2 mg/dL Final   Alkaline Phosphatase 12/12/2021 45  39 - 117 U/L Final   AST 12/12/2021 13  0 - 37 U/L Final   ALT 12/12/2021 15  0 - 53 U/L Final   Total Protein 12/12/2021 6.7  6.0 - 8.3 g/dL Final   Albumin 87/88/7976 4.3  3.5 - 5.2 g/dL Final   GFR 87/88/7976 70.05  >60.00 mL/min Final   Calcium  12/12/2021 9.4  8.4 - 10.5 mg/dL Final   WBC 87/88/7976 6.4  4.0 - 10.5 K/uL Final   RBC 12/12/2021 5.20  4.22 - 5.81 Mil/uL Final   Platelets 12/12/2021 211.0  150.0 - 400.0 K/uL Final   Hemoglobin 12/12/2021 15.2  13.0 - 17.0 g/dL Final   HCT 87/88/7976 44.6  39.0 - 52.0 % Final   MCV  12/12/2021 85.7  78.0 - 100.0 fl Final   MCHC 12/12/2021 34.0  30.0 - 36.0 g/dL Final   RDW 87/88/7976 14.5  11.5 - 15.5 % Final   Direct LDL 12/12/2021 154.0  mg/dL Final  No image results found. No results found.       ASSESSMENT & PLAN   Assessment & Plan Allergic conjunctivitis of both eyes Acute allergic conjunctivitis, bilateral    Watery eyes are suggestive of allergic conjunctivitis, likely related to the upper airway cough syndrome. No specific allergen identified, but symptoms are consistent with an allergic reaction. Recent increase in eye watering supports an allergic etiology. Recommend Pataday eye drops for antihistamine relief. Switch to cromolyn  eye drops if insurance coverage is an issue. Advise monitoring for improvement with current treatment and report back if symptoms persist. Upper airway cough syndrome Chronic pansinusitis Upper airway cough syndrome with postnasal drip   Cough has persisted for two weeks with a scratchy throat and mucus in the upper airway. Differential diagnosis includes viral infection, allergic reaction, or post-viral subacute cough. Symptoms such as watery eyes and absence of sinus congestion or sneezing suggest an allergic etiology, but enlarged tonsils and absence of typical allergic symptoms favor a viral cause. The presence of watery eyes and recent increase in watering suggests an allergic component. Prescribe Flonase  nasal spray once daily to reduce postnasal drip. Recommend saline nasal spray to rinse out sinuses before using Flonase . Advise use of Claritin  for systemic allergy  relief and suggest Sudafed for drying up mucus if needed. Prescribe doxycycline  as a backup antibiotic if symptoms persist despite other treatments. Advise against immediate use of antibiotics unless symptoms persist beyond two weeks. Discuss potential side effects of prednisone , including insomnia, GI upset, and mood changes, and advise against its use unless symptoms are severe and persistent. Rising PSA level Elevated prostate specific antigen (PSA)   Previous PSA levels were rising. Current PSA level to be checked today. If PSA continues to rise, referral to urology for further evaluation, including potential biopsy, may be necessary. He is aware of the potential need for further investigation. Discussed that  prostate cancer is slow-growing, and a few months delay in testing is not critical. Order PSA test today. Discuss potential referral to urology if PSA levels continue to rise. Inform about the possibility of a prostate biopsy if indicated by PSA results.   ORDER ASSOCIATIONS  #   DIAGNOSIS / CONDITION ICD-10 ENCOUNTER ORDER     ICD-10-CM   1. Upper airway cough syndrome  R05.8 POC COVID-19 BinaxNow    fluticasone  (FLONASE ) 50 MCG/ACT nasal spray    Saline (SIMPLY SALINE) 0.9 % AERS    loratadine  (CLARITIN ) 10 MG tablet    pseudoephedrine  (SUDAFED 12 HOUR) 120 MG 12 hr tablet    2. Allergic conjunctivitis of both eyes  H10.13 cromolyn  (OPTICROM ) 4 % ophthalmic solution    3. Chronic pansinusitis  J32.4 POC COVID-19 BinaxNow    doxycycline  (VIBRA -TABS) 100 MG tablet    4. Rising PSA level  R97.20 PSA           Orders Placed in Encounter:   Meds ordered this encounter  Medications   fluticasone  (FLONASE ) 50 MCG/ACT nasal spray    Sig: Place 2 sprays into both nostrils daily.    Dispense:  16 g    Refill:  6   Saline (SIMPLY SALINE) 0.9 % AERS    Sig: Place 2 each into the nose as directed. Use nightly for sinus hygiene long-term.  Can also be used as many times daily as desired to assist with clearing congested sinuses.    Dispense:  127 mL    Refill:  11   loratadine  (CLARITIN ) 10 MG tablet    Sig: Take 1 tablet (10 mg total) by mouth daily.    Dispense:  30 tablet    Refill:  11   pseudoephedrine  (SUDAFED 12 HOUR) 120 MG 12 hr tablet    Sig: Take 1 tablet (120 mg total) by mouth 2 (two) times daily. Only take if all else fails    Dispense:  20 tablet    Refill:  0   cromolyn  (OPTICROM ) 4 % ophthalmic solution    Sig: Place 1 drop into both eyes 4 (four) times daily.    Dispense:  10 mL    Refill:  0   doxycycline  (VIBRA -TABS) 100 MG tablet    Sig: Take 1 tablet (100 mg total) by mouth 2 (two) times daily. Only take if all else fails    Dispense:  20 tablet    Refill:   0    Orders Placed This Encounter  Procedures   PSA   POC COVID-19 BinaxNow        This document was synthesized by artificial intelligence (Abridge) using HIPAA-compliant recording of the clinical interaction;   We discussed the use of AI scribe software for clinical note transcription with the patient, who gave verbal consent to proceed. additional Info: This encounter employed state-of-the-art, real-time, collaborative documentation. The patient actively reviewed and assisted in updating their electronic medical record on a shared screen, ensuring transparency and facilitating joint problem-solving for the problem list, overview, and plan. This approach promotes accurate, informed care. The treatment plan was discussed and reviewed in detail, including medication safety, potential side effects, and all patient questions. We confirmed understanding and comfort with the plan. Follow-up instructions were established, including contacting the office for any concerns, returning if symptoms worsen, persist, or new symptoms develop, and precautions for potential emergency department visits.

## 2023-08-30 NOTE — Patient Instructions (Addendum)
 VISIT SUMMARY:  You came in today for a follow-up regarding your past episode of meningitis and encephalitis. Since the episode in April 2024, you have not experienced any recurrence of symptoms and have been healthy without any fevers. You are not currently on any specific medication for meningitis or encephalitis and have not had any further episodes.  YOUR PLAN:  -ASEPTIC MENINGITIS AND ENCEPHALITIS: Aseptic meningitis and encephalitis are inflammations of the protective membranes covering the brain and spinal cord, usually caused by a viral infection. Your episode in April 2024 was not serious or life-threatening and is unlikely to recur. You are healthy and not at risk for recurrence. I have provided reassurance with a 99.999% confidence that recurrence is unlikely. I will also provide a letter stating that the meningitis and encephalitis was a one-time event and is unlikely to affect your future health. I recommend using Simply Saline nasal spray to reduce the risk of sinus infections and manage allergies. Using the nasal spray before Flonase  can enhance its effectiveness.  INSTRUCTIONS:  No follow-up is necessary unless you experience any new symptoms or health concerns. Continue to use Simply Saline nasal spray as recommended.    ALLERGY MANAGEMENT PLAN  This plan is designed to help manage your allergic rhinitis (nasal allergies) effectively. Follow these steps daily for best results.  Sinus saline sprays- use nightly, and after sneezing episodes or exposure to allergen.  Insert deeply and spray mist into nose while leaning over sink at 45 degrees,  while gently breathing. Also blow out onto tissue while leaning forward 45 degrees. Once daily, after a sinus rinse, use sensimist.  Just before bedtime is best. This only needed if allergies acting up.  If this is inadequate add-on once daily for levocetirizine / xyzal 5 mg for nondrowsy antihistamine Take benadryl 25 mg at bedtime also if  allergic mucus is persisting  When allergies cause chronic swelling in sinuses, it leads to sinus infections:    DAILY TREATMENT ROUTINE   Time of Day Treatment Steps  Morning 1. Saline Nasal Spray - Use to cleanse nasal passages 2. Xyzal (levocetirizine) - Take one tablet daily   Throughout Day Saline Nasal Spray - Use 2 additional times (mid-day and afternoon)   Evening/Bedtime 1. Saline Nasal Rinse - Thoroughly clean nasal passages 2. Flonase  Sensimist - Apply after nasal rinse 3. Benadryl (diphenhydramine) - Take 25mg  if experiencing persistent congestion    PROPER TECHNIQUE GUIDE       Saline Nasal Spray/Rinse Technique: Lean forward over sink at a 45-degree angle Turn head slightly to one side Insert spray tip into upper nostril Spray gently while breathing lightly through your nose Repeat on other side Gently blow nose to clear excess solution Use saline spray 3 times daily to keep nasal passages moist and clear allergens.       Flonase  Sensimist Technique: Shake bottle gently before each use Prime the bottle if it's new or hasn't been used for a week Tilt your head forward slightly Insert tip into nostril, pointing away from the center of your nose Spray while inhaling gently Repeat in other nostril Use Flonase  Sensimist once daily, preferably at bedtime after using saline rinse. It may take several days of regular use to feel maximum benefit.   WHY FLONASE  SENSIMIST?   Benefits of Flonase  Sensimist:  Alcohol-free and scent-free formula - gentler on sensitive nasal passages Fine mist application - more comfortable with less dripping down throat Effectively relieves nasal congestion, sneezing, runny nose, and even  eye symptoms 24-hour relief with once-daily dosing Uses a more potent form of fluticasone  that works at a lower dose Less liquid per spray means less discomfort  UNDERSTANDING YOUR MEDICATIONS   Medication How It Works Important Notes  Flonase   Sensimist (fluticasone  furoate) Reduces inflammation in nasal passages, addressing the underlying cause of allergy symptoms - Takes several days for full effect - Use daily for best results - Safe for long-term use   Xyzal (levocetirizine) Blocks histamine to reduce allergy symptoms like sneezing and itching - Take at the same time each day - May cause drowsiness in some people - Once-daily dosing   Benadryl (diphenhydramine) Antihistamine that provides additional relief for breakthrough symptoms - Causes drowsiness - Use only at bedtime - For occasional use when needed   Saline Spray/Rinse Physically removes allergens and moistens nasal passages - Safe to use frequently - Improves effectiveness of other treatments - Reduces nasal irritation    CONTACT YOUR PROVIDER IF: Your symptoms do not improve after 1-2 weeks of following this plan You develop sinus pain with fever or green/yellow discharge You experience frequent nosebleeds You develop new or worsening symptoms You have questions about your treatment plan     ADDITIONAL ALLERGY MANAGEMENT TIPS   HELPFUL STRATEGIES: ?? Keep windows closed during high pollen seasons ??? Use allergen-proof covers for pillows and mattresses ?? Vacuum regularly with a HEPA filter vacuum ?? Shower and change clothes after spending time outdoors ?? Check local pollen counts and limit outdoor time when counts are high ?? Stay well-hydrated to help keep mucous membranes moist

## 2023-08-31 ENCOUNTER — Other Ambulatory Visit: Payer: Self-pay

## 2023-08-31 DIAGNOSIS — R972 Elevated prostate specific antigen [PSA]: Secondary | ICD-10-CM

## 2023-08-31 NOTE — Progress Notes (Signed)
Lab ordered placed

## 2023-10-15 ENCOUNTER — Ambulatory Visit

## 2023-10-15 VITALS — Ht 67.0 in | Wt 181.0 lb

## 2023-10-15 DIAGNOSIS — Z0000 Encounter for general adult medical examination without abnormal findings: Secondary | ICD-10-CM

## 2023-10-15 NOTE — Patient Instructions (Signed)
 Darren Bryant,  Thank you for taking the time for your Medicare Wellness Visit. I appreciate your continued commitment to your health goals. Please review the care plan we discussed, and feel free to reach out if I can assist you further.  Medicare recommends these wellness visits once per year to help you and your care team stay ahead of potential health issues. These visits are designed to focus on prevention, allowing your provider to concentrate on managing your acute and chronic conditions during your regular appointments.  Please note that Annual Wellness Visits do not include a physical exam. Some assessments may be limited, especially if the visit was conducted virtually. If needed, we may recommend a separate in-person follow-up with your provider.  Ongoing Care Seeing your primary care provider every 3 to 6 months helps us  monitor your health and provide consistent, personalized care.   Referrals If a referral was made during today's visit and you haven't received any updates within two weeks, please contact the referred provider directly to check on the status.  Recommended Screenings:  Health Maintenance  Topic Date Due   Pneumococcal Vaccine for age over 91 (1 of 1 - PCV) Never done   Medicare Annual Wellness Visit  12/08/2022   Flu Shot  08/03/2023   COVID-19 Vaccine (5 - 2025-26 season) 09/03/2023   DTaP/Tdap/Td vaccine (2 - Td or Tdap) 11/08/2023*   Colon Cancer Screening  01/19/2029   Hepatitis C Screening  Completed   Zoster (Shingles) Vaccine  Completed   Meningitis B Vaccine  Aged Out  *Topic was postponed. The date shown is not the original due date.        No data to display         Advance Care Planning is important because it: Ensures you receive medical care that aligns with your values, goals, and preferences. Provides guidance to your family and loved ones, reducing the emotional burden of decision-making during critical moments.  Vision: Annual vision  screenings are recommended for early detection of glaucoma, cataracts, and diabetic retinopathy. These exams can also reveal signs of chronic conditions such as diabetes and high blood pressure.  Dental: Annual dental screenings help detect early signs of oral cancer, gum disease, and other conditions linked to overall health, including heart disease and diabetes.  Please see the attached documents for additional preventive care recommendations.

## 2023-10-15 NOTE — Progress Notes (Signed)
 Subjective:   Darren Bryant is a 68 y.o. who presents for a Medicare Wellness preventive visit.  As a reminder, Annual Wellness Visits don't include a physical exam, and some assessments may be limited, especially if this visit is performed virtually. We may recommend an in-person follow-up visit with your provider if needed.  Visit Complete: Virtual I connected with  Darren Bryant on 10/15/23 by a audio enabled telemedicine application and verified that I am speaking with the correct person using two identifiers.  Patient Location: Home  Provider Location: Office/Clinic  I discussed the limitations of evaluation and management by telemedicine. The patient expressed understanding and agreed to proceed.  Vital Signs: Because this visit was a virtual/telehealth visit, some criteria may be missing or patient reported. Any vitals not documented were not able to be obtained and vitals that have been documented are patient reported.  VideoDeclined- This patient declined Librarian, academic. Therefore the visit was completed with audio only.  Persons Participating in Visit: Patient.  AWV Questionnaire: Yes: Patient Medicare AWV questionnaire was completed by the patient on 10/14/23; I have confirmed that all information answered by patient is correct and no changes since this date.  Cardiac Risk Factors include: advanced age (>62men, >73 women);dyslipidemia;male gender     Objective:    Today's Vitals   10/15/23 1308  Weight: 181 lb (82.1 kg)  Height: 5' 7 (1.702 m)   Body mass index is 28.35 kg/m.     10/15/2023    1:12 PM  Advanced Directives  Does Patient Have a Medical Advance Directive? Yes  Type of Estate agent of [place];Living will  Copy of Healthcare Power of Attorney in Chart? No - copy requested    Current Medications (verified) Outpatient Encounter Medications as of 10/15/2023  Medication Sig   b complex  vitamins tablet Take 1 tablet by mouth daily.   Cholecalciferol (D3 ADULT PO) Take by mouth daily.   [DISCONTINUED] cromolyn  (OPTICROM ) 4 % ophthalmic solution Place 1 drop into both eyes 4 (four) times daily.   [DISCONTINUED] doxycycline  (VIBRA -TABS) 100 MG tablet Take 1 tablet (100 mg total) by mouth 2 (two) times daily. Only take if all else fails   [DISCONTINUED] fluticasone  (FLONASE ) 50 MCG/ACT nasal spray Place 2 sprays into both nostrils daily.   [DISCONTINUED] loratadine  (CLARITIN ) 10 MG tablet Take 1 tablet (10 mg total) by mouth daily.   [DISCONTINUED] pseudoephedrine  (SUDAFED 12 HOUR) 120 MG 12 hr tablet Take 1 tablet (120 mg total) by mouth 2 (two) times daily. Only take if all else fails   [DISCONTINUED] Saline (SIMPLY SALINE) 0.9 % AERS Place 2 each into the nose as directed. Use nightly for sinus hygiene long-term.  Can also be used as many times daily as desired to assist with clearing congested sinuses.   No facility-administered encounter medications on file as of 10/15/2023.    Allergies (verified) Patient has no known allergies.   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 11/08/2021   [facility] and wears bifocals for bad reading and requiring distance vision correction   Overweight 11/08/2021   Mostly truncal   Tinnitus 11/08/2021   intermittent   Trigger finger of right hand 11/08/2021   Right pinky finger kind  of has on even movement and flex through the proximal interphalangeal joint and a nonsmooth way   Vitamin D  deficiency 11/08/2021   Reports he is taking vit d  since  Lab Results Component Value Date/Time  VD25OH 29.45 (L) 11/03/2019 03:18 PM     Past Surgical History:  Procedure Laterality Date   COLONOSCOPY  06/30/2008   TA   HERNIA REPAIR     JOINT REPLACEMENT  2022   Knee   KNEE SURGERY Bilateral    Family History  Problem Relation Age of Onset   Heart disease Mother    Heart attack Mother    Hearing loss Father    Hypertension Father    Stroke Brother    Pancreatic cancer Brother    Heart disease Other    Stroke Other    Hyperlipidemia Other    Colon cancer Neg Hx    Colon polyps Neg Hx    Rectal cancer Neg Hx    Esophageal cancer Neg Hx    Stomach cancer Neg Hx    Social History   Socioeconomic History   Marital status: Divorced    Spouse name: Not on file   Number of children: Not on file   Years of education: COLLEGE   Highest education level: Bachelor's degree (e.g., BA, AB, BS)  Occupational History   Occupation: SALES  Tobacco Use   Smoking status: Never   Smokeless tobacco: Never  Vaping Use   Vaping status: Never Used  Substance and Sexual Activity   Alcohol use: Yes    Alcohol/week: 6.0 standard drinks of alcohol    Types: 2 Glasses of wine, 4 Cans of beer per week    Comment: 10 drinks per week   Drug use: Never   Sexual activity: Not Currently    Birth control/protection: None  Other Topics Concern   Not on file  Social History Narrative   EXERCISE TWICE WEEKLY FOR 1 HOUR. Married. Education: [employer].   Social Drivers of Corporate investment banker Strain: Low Risk  (10/14/2023)   Overall Financial Resource Strain (CARDIA)    Difficulty of Paying Living Expenses: Not very hard  Food Insecurity: No Food Insecurity (10/14/2023)   Hunger Vital Sign    Worried About Running Out of Food in the Last Year: Never true    Ran Out of Food in  the Last Year: Never true  Transportation Needs: No Transportation Needs (10/14/2023)   PRAPARE - Administrator, Civil Service (Medical): No    Lack of Transportation (Non-Medical): No  Physical Activity: Insufficiently Active (10/14/2023)   Exercise Vital Sign    Days of Exercise per Week: 4 days    Minutes of Exercise per Session: 30 min  Stress: No Stress Concern Present (10/14/2023)   [employer] of Occupational Health - Occupational Stress Questionnaire    Feeling of Stress: Only a little  Recent Concern: Stress - Stress Concern Present (08/29/2023)   [employer] of Occupational Health - Occupational Stress Questionnaire    Feeling of Stress: To some extent  Social Connections: Socially Isolated (10/14/2023)   Social Connection and Isolation  Panel    Frequency of Communication with Friends and Family: More than three times a week    Frequency of Social Gatherings with Friends and Family: Three times a week    Attends Religious Services: Patient declined    Active Member of Clubs or Organizations: No    Attends Engineer, structural: Not on file    Marital Status: Divorced    Tobacco Counseling Counseling given: Not Answered    Clinical Intake:  Pre-visit preparation completed: Yes  Pain : No/denies pain     BMI - recorded: 28.35 Nutritional Status: BMI 25 -29 Overweight Nutritional Risks: None Diabetes: No  Lab Results  Component Value Date   HGBA1C 6.2 11/08/2022   HGBA1C 6.3 12/12/2021   HGBA1C 6.3 11/03/2019     How often do you need to have someone help you when you read instructions, pamphlets, or other written materials from your doctor or pharmacy?: 1 - Never  Interpreter Needed?: No  Information entered by :: Ellouise Haws, LPN   Activities of Daily Living     10/14/2023    8:42 PM  In your present state of health, do you have any difficulty performing the following activities:  Hearing? 0  Vision? 0   Difficulty concentrating or making decisions? 0  Walking or climbing stairs? 0  Dressing or bathing? 0  Doing errands, shopping? 0  Preparing Food and eating ? N  Using the Toilet? N  In the past six months, have you accidently leaked urine? N  Do you have problems with loss of bowel control? N  Managing your Medications? N  Managing your Finances? N  Housekeeping or managing your Housekeeping? N    Patient Care Team: Jesus Bernardino MATSU, MD as PCP - General (Internal Medicine)  I have updated your Care Teams any recent Medical Services you may have received from other providers in the past year.     Assessment:   This is a routine wellness examination for Alfonzia.  Hearing/Vision screen Hearing Screening - Comments:: Pt denies any hearing issues  Vision Screening - Comments:: Wears rx glasses - up to date with routine eye exams with [facility]    Goals Addressed             This Visit's Progress    Patient Stated       Weight loss and get in better shape        Depression Screen     10/15/2023    1:12 PM 11/08/2022    9:31 AM 11/08/2021    9:05 AM 11/03/2019    4:33 PM 02/15/2017    4:28 PM 06/22/2015    5:59 PM 12/18/2014    8:32 AM  PHQ 2/9 Scores  PHQ - 2 Score 0 0 0 0 0 0 0  PHQ- 9 Score  1  0       Fall Risk     10/14/2023    8:42 PM 11/08/2022    9:30 AM 11/08/2021    9:05 AM 11/03/2019    2:08 PM 02/15/2017    4:28 PM  Fall Risk   Falls in the past year? 0 0 0 0 No   Number falls in past yr: 0 0 0 0   Injury with Fall? 0 0 0 0   Risk for fall due to : No Fall Risks No Fall Risks No Fall Risks    Follow up  Falls evaluation completed Falls evaluation completed  Data saved with a previous flowsheet row definition    MEDICARE RISK AT HOME:  Medicare Risk at Home Any stairs in or around the home?: (Patient-Rptd) Yes If so, are there any without handrails?: (Patient-Rptd) Yes Home free of loose throw rugs in walkways, pet beds, electrical cords,  etc?: (Patient-Rptd) No Adequate lighting in your home to reduce risk of falls?: (Patient-Rptd) Yes Life alert?: (Patient-Rptd) No Use of a cane, walker or w/c?: (Patient-Rptd) No Grab bars in the bathroom?: (Patient-Rptd) No Shower chair or bench in shower?: (Patient-Rptd) No Elevated toilet seat or a handicapped toilet?: (Patient-Rptd) No  TIMED UP AND GO:  Was the test performed?  No  Cognitive Function: 6CIT completed        10/15/2023    1:13 PM 12/07/2021    5:03 PM  6CIT Screen  What Year? 0 points 0 points  What month? 0 points 0 points  What time? 0 points 0 points  Count back from 20 0 points 0 points  Months in reverse 0 points 0 points  Repeat phrase 0 points 0 points  Total Score 0 points 0 points    Immunizations Immunization History  Administered Date(s) Administered   Fluad Quad(high Dose 65+) 11/08/2021   Fluad Trivalent(High Dose 65+) 10/19/2022   Influenza Split 10/25/2012   Influenza, Seasonal, Injecte, Preservative Fre 12/07/2011   Influenza,inj,Quad PF,6+ Mos 10/25/2012, 11/25/2013, 10/29/2014, 12/04/2015, 11/03/2019   Influenza,inj,quad, With Preservative 01/02/2017   Moderna Sars-Covid-2 Vaccination 03/03/2019, 04/03/2019   PFIZER(Purple Top)SARS-COV-2 Vaccination 03/27/2019, 04/21/2019   Tdap 05/13/2008   Zoster Recombinant(Shingrix) 08/27/2021, 02/01/2022    Screening Tests Health Maintenance  Topic Date Due   Pneumococcal Vaccine: 50+ Years (1 of 1 - PCV) Never done   Influenza Vaccine  08/03/2023   COVID-19 Vaccine (5 - 2025-26 season) 09/03/2023   DTaP/Tdap/Td (2 - Td or Tdap) 11/08/2023 (Originally 05/14/2018)   Medicare Annual Wellness (AWV)  10/14/2024   Colonoscopy  01/19/2029   Hepatitis C Screening  Completed   Zoster Vaccines- Shingrix  Completed   Meningococcal B Vaccine  Aged Out    Health Maintenance Items Addressed: Vaccines Due: and discussed   Additional Screening:  Vision Screening: Recommended annual  ophthalmology exams for early detection of glaucoma and other disorders of the eye. Is the patient up to date with their annual eye exam?  Yes  Who is the provider or what is the name of the office in which the patient attends annual eye exams? [facility]   Dental Screening: Recommended annual dental exams for proper oral hygiene  Community Resource Referral / Chronic Care Management: CRR required this visit?  No   CCM required this visit?  No   Plan:    I have personally reviewed and noted the following in the patient's chart:   Medical and social history Use of alcohol, tobacco or illicit drugs  Current medications and supplements including opioid prescriptions. Patient is not currently taking opioid prescriptions. Functional ability and status Nutritional status Physical activity Advanced directives List of other physicians Hospitalizations, surgeries, and ER visits in previous 12 months Vitals Screenings to include cognitive, depression, and falls Referrals and appointments  In addition, I have reviewed and discussed with patient certain preventive protocols, quality metrics, and best practice recommendations. A written personalized care plan for preventive services as well as general preventive health recommendations were provided to patient.   Ellouise VEAR Haws, LPN   [id number]   After Visit Summary: (MyChart) Due to this being a telephonic visit,  the after visit summary with patients personalized plan was offered to patient via MyChart   Notes: Nothing significant to report at this time.

## 2023-11-12 ENCOUNTER — Encounter: Payer: Medicare Other | Admitting: Internal Medicine

## 2023-11-23 ENCOUNTER — Encounter: Admitting: Internal Medicine

## 2023-12-21 ENCOUNTER — Ambulatory Visit (INDEPENDENT_AMBULATORY_CARE_PROVIDER_SITE_OTHER): Admitting: Internal Medicine

## 2023-12-21 VITALS — BP 136/74 | HR 72 | Temp 98.0°F | Ht 67.0 in | Wt 194.0 lb

## 2023-12-21 DIAGNOSIS — Z136 Encounter for screening for cardiovascular disorders: Secondary | ICD-10-CM

## 2023-12-21 DIAGNOSIS — Z23 Encounter for immunization: Secondary | ICD-10-CM

## 2023-12-21 DIAGNOSIS — Z125 Encounter for screening for malignant neoplasm of prostate: Secondary | ICD-10-CM | POA: Diagnosis not present

## 2023-12-21 DIAGNOSIS — Z5181 Encounter for therapeutic drug level monitoring: Secondary | ICD-10-CM

## 2023-12-21 DIAGNOSIS — R7303 Prediabetes: Secondary | ICD-10-CM

## 2023-12-21 DIAGNOSIS — R972 Elevated prostate specific antigen [PSA]: Secondary | ICD-10-CM

## 2023-12-21 DIAGNOSIS — E663 Overweight: Secondary | ICD-10-CM | POA: Diagnosis not present

## 2023-12-21 DIAGNOSIS — E559 Vitamin D deficiency, unspecified: Secondary | ICD-10-CM | POA: Diagnosis not present

## 2023-12-21 DIAGNOSIS — Z0001 Encounter for general adult medical examination with abnormal findings: Secondary | ICD-10-CM | POA: Diagnosis not present

## 2023-12-21 DIAGNOSIS — Z8249 Family history of ischemic heart disease and other diseases of the circulatory system: Secondary | ICD-10-CM

## 2023-12-21 DIAGNOSIS — E782 Mixed hyperlipidemia: Secondary | ICD-10-CM | POA: Diagnosis not present

## 2023-12-21 DIAGNOSIS — E8881 Metabolic syndrome: Secondary | ICD-10-CM

## 2023-12-21 DIAGNOSIS — Z0000 Encounter for general adult medical examination without abnormal findings: Secondary | ICD-10-CM

## 2023-12-21 LAB — CBC WITH DIFFERENTIAL/PLATELET
Basophils Absolute: 0.1 K/uL (ref 0.0–0.1)
Basophils Relative: 0.8 % (ref 0.0–3.0)
Eosinophils Absolute: 0.1 K/uL (ref 0.0–0.7)
Eosinophils Relative: 2 % (ref 0.0–5.0)
HCT: 42.3 % (ref 39.0–52.0)
Hemoglobin: 14.2 g/dL (ref 13.0–17.0)
Lymphocytes Relative: 30.7 % (ref 12.0–46.0)
Lymphs Abs: 2.1 K/uL (ref 0.7–4.0)
MCHC: 33.5 g/dL (ref 30.0–36.0)
MCV: 86.7 fl (ref 78.0–100.0)
Monocytes Absolute: 0.4 K/uL (ref 0.1–1.0)
Monocytes Relative: 6.2 % (ref 3.0–12.0)
Neutro Abs: 4 K/uL (ref 1.4–7.7)
Neutrophils Relative %: 60.3 % (ref 43.0–77.0)
Platelets: 232 K/uL (ref 150.0–400.0)
RBC: 4.88 Mil/uL (ref 4.22–5.81)
RDW: 14.4 % (ref 11.5–15.5)
WBC: 6.7 K/uL (ref 4.0–10.5)

## 2023-12-21 LAB — LIPID PANEL
Cholesterol: 227 mg/dL — ABNORMAL HIGH (ref 28–200)
HDL: 45.3 mg/dL
LDL Cholesterol: 140 mg/dL — ABNORMAL HIGH (ref 10–99)
NonHDL: 181.8
Total CHOL/HDL Ratio: 5
Triglycerides: 209 mg/dL — ABNORMAL HIGH (ref 10.0–149.0)
VLDL: 41.8 mg/dL — ABNORMAL HIGH (ref 0.0–40.0)

## 2023-12-21 LAB — COMPREHENSIVE METABOLIC PANEL WITH GFR
ALT: 19 U/L (ref 3–53)
AST: 17 U/L (ref 5–37)
Albumin: 4.4 g/dL (ref 3.5–5.2)
Alkaline Phosphatase: 45 U/L (ref 39–117)
BUN: 14 mg/dL (ref 6–23)
CO2: 28 meq/L (ref 19–32)
Calcium: 9.2 mg/dL (ref 8.4–10.5)
Chloride: 102 meq/L (ref 96–112)
Creatinine, Ser: 0.97 mg/dL (ref 0.40–1.50)
GFR: 80.32 mL/min
Glucose, Bld: 100 mg/dL — ABNORMAL HIGH (ref 70–99)
Potassium: 4.1 meq/L (ref 3.5–5.1)
Sodium: 137 meq/L (ref 135–145)
Total Bilirubin: 0.6 mg/dL (ref 0.2–1.2)
Total Protein: 6.8 g/dL (ref 6.0–8.3)

## 2023-12-21 LAB — PSA: PSA: 4.57 ng/mL — ABNORMAL HIGH (ref 0.10–4.00)

## 2023-12-21 LAB — VITAMIN D 25 HYDROXY (VIT D DEFICIENCY, FRACTURES): VITD: 23.16 ng/mL — ABNORMAL LOW (ref 30.00–100.00)

## 2023-12-21 LAB — HEMOGLOBIN A1C: Hgb A1c MFr Bld: 6.3 % (ref 4.6–6.5)

## 2023-12-21 NOTE — Progress Notes (Signed)
 " [facility] [address] [place], [place] [address] Office:  [phone]  -- Annual Preventive Medical Office Visit --  Patient:  Darren Bryant      Age: 68 y.o.       Sex:  male  Date:   12/21/2023 Patient Care Team: Jesus Bernardino MATSU, MD as PCP - General (Internal Medicine) Today's Healthcare Provider: Bernardino MATSU Jesus, MD  ========================================= Chief complaint: Annual Exam (Pt is present for cpe fasting for labs today)  Purpose of Visit: Comprehensive preventive health assessment and personalized health maintenance planning.  This encounter was conducted as a Comprehensive Physical Exam (CPE) preventive care annual visit. The patient's medical history and problem list were reviewed to inform individualized preventive care recommendations.   No problem-specific medical treatment was provided during this visit.  Assessment & Plan Encounter for annual general medical examination with abnormal findings in adult Healthcare maintenance Need for vaccination Screening for cardiovascular condition Medication monitoring encounter Comprehensive Preventive Examination (Annual Wellness Visit) Performed Today.  Abridge Summary:  Discussed the importance of vaccinations, dental care, and sleep hygiene. He received a flu vaccine today. Encouraged dental care and  hours of sleep per night. Discussed potential benefits of cardiac calcium  score and lipoprotein A testing for cardiac risk assessment, which he is considering. ?? Core Review: Interval HPI/ROS reviewed. History, current Medications (including reconciliation), known Allergies, and Immunization Status were thoroughly reviewed and updated. ?? Risk Assessment: Comprehensive Family, Social (including tobacco/alcohol/substance use), and Safety/Functional risks were assessed. Vitals and a complete, age-appropriate Physical Exam were fully documented. ? Preventive Planning: I reviewed all  appropriate preventive screening tests (e.g., mammogram, colonoscopy, cervical cancer screening) and immunization needs (e.g., influenza, pneumococcal, Zoster). ??? Counseling & Education: Provided individualized counseling on Healthy Diet (e.g., DASH/Mediterranean), Regular Physical Activity (consistent with CDC guidelines), Sleep Hygiene, Stress Management strategies, and Mental Health screening results. ?? Conclusion: Shared decision-making was performed for all recommended interventions. Orders were placed for indicated screenings and/or vaccinations. Written educational materials were provided to reinforce today's counseling and plan.  Screening for malignant neoplasm of prostate Elevated prostate specific antigen (PSA) Elevated prostate specific antigen (PSA)   PSA levels are stable despite previous increases. He wakes up to urinate around 5 AM, which he considers normal. Monitoring PSA is crucial due to prostate cancer risk, considering his age and family history. He agreed to repeat PSA testing. Ordered PSA test with routine blood work. Metabolic syndrome Pre-diabetes FH: coronary arteriosclerosis Moderate mixed hyperlipidemia not requiring statin therapy He has pre-diabetes. Monitoring A1c levels is important to assess diabetes progression. Ordered A1c test. Vitamin D  deficiency He has a known vitamin D  deficiency. Monitoring vitamin D  levels is essential. Ordered vitamin D  level test. Overweight He is overweight.check tsh    ICD-10-CM   1. Encounter for annual general medical examination with abnormal findings in adult  Z00.01     2. Elevated prostate specific antigen (PSA)  R97.20 Lipid panel    Comprehensive metabolic panel with GFR    CBC with Differential/Platelet    TSH Rfx on Abnormal to Free T4    Lipoprotein A (LPA)    Vitamin D  (25 hydroxy)    HgB A1c    PSA    3. Healthcare maintenance  Z00.00     4. Need for vaccination  Z23     5. Moderate mixed hyperlipidemia  not requiring statin therapy  E78.2     6. Metabolic syndrome  E88.810 Lipid panel  7. Pre-diabetes  R73.03 HgB A1c    8. Vitamin D  deficiency  E55.9     9. Overweight  E66.3     10. Screening for malignant neoplasm of prostate  Z12.5 PSA    11. FH: coronary arteriosclerosis  Z82.49 Lipoprotein A (LPA)    12. Screening for cardiovascular condition  Z13.6 Lipoprotein A (LPA)    13. Medication monitoring encounter  Z51.81 TSH Rfx on Abnormal to Free T4       Reviewed Immunization History  Administered Date(s) Administered   Fluad Quad(high Dose 65+) 11/08/2021   Fluad Trivalent(High Dose 65+) 10/19/2022   INFLUENZA, HIGH DOSE SEASONAL PF 12/21/2023   Influenza Split 10/25/2012   Influenza, Seasonal, Injecte, Preservative Fre 12/07/2011   Influenza,inj,Quad PF,6+ Mos 10/25/2012, 11/25/2013, 10/29/2014, 12/04/2015, 11/03/2019   Influenza,inj,quad, With Preservative 01/02/2017   Influenza-Unspecified 11/08/2021   Moderna Sars-Covid-2 Vaccination 03/03/2019, 04/03/2019   PFIZER(Purple Top)SARS-COV-2 Vaccination 03/27/2019, 04/21/2019, 10/10/2019   Pfizer Covid-19 Vaccine Bivalent Booster 57yrs & up 12/29/2020   Tdap 05/13/2008   Zoster Recombinant(Shingrix) 08/27/2021, 02/01/2022    Flu and pneumonia encouraged- flu shot agreed to Health Maintenance  Topic Date Due   Pneumococcal Vaccine: 50+ Years (1 of 1 - PCV) Never done   DTaP/Tdap/Td (2 - Td or Tdap) 05/14/2018   COVID-19 Vaccine (7 - 2025-26 season) 01/06/2024 (Originally 09/03/2023)   Medicare Annual Wellness (AWV)  10/14/2024   Colonoscopy  01/19/2029   Influenza Vaccine  Completed   Hepatitis C Screening  Completed   Zoster Vaccines- Shingrix  Completed   Meningococcal B Vaccine  Aged Out    Reviewed the following verbally with patient and provided AVS materials: HEALTH MAINTENANCE COUNSELING AND ANTICIPATORY GUIDANCE    Preventive Measure Recommendation  Eye Exams Every 1-2 years  Dental Care Cleanings  every 6 months or more, brush/floss 3x daily  Sinus Care Saline spray rinses daily  Sleep 8 hours nightly, good sleep hygiene, e-monitoring if any daytime drowsiness;  he only gets 5-8 hr wakes up in middle of night can't always get back to sleep. Not interested in sleep study  Diet Fruits/vegetables/fiber/healthy fats, balance and moderation  Exercise 150 minutes weekly  Risk Behaviors Discouraged any/all high risk behaviors    CANCER SCREENING SHARED DECISION MAKING    Thyroid  Thyroid  was palpated for nodules today.  Prostate Individualized risks/benefits/costs discussed Lab Results  Component Value Date   PSA 5.10 (H) 08/30/2023   PSA 5.73 (H) 11/08/2022   PSA 3.59 11/03/2019   PSA 2.42 12/18/2014   PSA 2.28 11/25/2013   PSA 1.91 10/25/2012   PSA 2.52 09/19/2011    Colon HM Colonoscopy          Upcoming     Colonoscopy (Every 7 Years) Next due on 01/19/2029    01/19/2022  COLONOSCOPY  Only the first 1 history entries have been loaded, but more history exists.             Lung Current guidelines recommend individuals aged 60 to 36 who currently smoke or formerly smoked and have a >= 20 pack-year smoking history should undergo annual screening with low-dose computed tomography (LDCT). Tobacco Use: Low Risk (12/21/2023)   Patient History    Smoking Tobacco Use: Never    Smokeless Tobacco Use: Never    Passive Exposure: Not on file  Tobacco Use History[1]  Skin Advised regular sunscreen use. Patient denies worrisome, changing, or new skin lesions. Offered to include images in chart for surveillance. Showed patient these pictures of  melanomas for reference to educate for self-monitoring.    Discussed the use of AI scribe software for clinical note transcription with the patient, who gave verbal consent to proceed.  History of Present Illness  68 year old male who presents for an annual physical exam.  He has a history of a lazy eye as a child, treated with a patch.  For the past two years, he has experienced issues with his left eye. An ophthalmologist found no neurological concerns, and surgery was suggested as a potential option. He is not experiencing vision loss.  He experiences sleep disturbances, often waking up in the middle of the night, but usually manages to get between five to eight hours of sleep. He attributes some disturbances to traveling and staying in hotels. He does not typically wake up to urinate during the night.  He has a history of elevated PSA levels, which had previously decreased. He does not have any current symptoms of urinary tract issues and does not wake up at night to urinate.  His father was diagnosed with Alzheimer's disease in his late 10. He has not noticed any memory issues himself and is not currently interested in genetic testing for Alzheimer's.  He has a known vitamin D  deficiency and prediabetes, for which he has been monitored. He has not experienced any heart problems and does have a family history of coronary disease.  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  ROS A comprehensive ROS was negative for any concerning symptoms.   Completed medication reconciliation: Current Outpatient Medications on File Prior to Visit  Medication Sig   b complex vitamins tablet Take 1 tablet by mouth daily.   Cholecalciferol (D3 ADULT PO) Take by mouth daily.   No current facility-administered medications on file prior to visit.  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   [facility] and wears bifocals for bad reading and requiring distance vision correction   Overweight 11/08/2021   Mostly truncal   Tinnitus 11/08/2021   intermittent   Trigger finger of right hand 11/08/2021   Right pinky finger kind of has on even movement and flex through the proximal interphalangeal joint and a nonsmooth way   Vitamin D  deficiency 11/08/2021   Reports he is taking vit d  since  Lab Results Component Value Date/Time  VD25OH 29.45 (L) 11/03/2019 03:18 PM     Past Surgical History:  Procedure Laterality Date   COLONOSCOPY  06/30/2008   TA   HERNIA REPAIR     JOINT REPLACEMENT  2022   Knee   KNEE SURGERY Bilateral    Social History   Socioeconomic History   Marital status: Divorced    Spouse name: Not on file   Number of children: Not on file   Years  of education: COLLEGE   Highest education level: Bachelor's degree (e.g., BA, AB, BS)  Occupational History   Occupation: SALES  Tobacco Use   Smoking status: Never   Smokeless tobacco: Never  Vaping Use   Vaping status: Never Used  Substance and Sexual Activity   Alcohol use: Yes    Alcohol/week: 6.0 standard drinks of alcohol    Types: 2 Glasses of wine, 4 Cans of beer per week    Comment: 10 drinks per week   Drug use: Never   Sexual activity: Not Currently    Birth control/protection: None  Other Topics Concern   Not on file  Social History Narrative   EXERCISE TWICE WEEKLY FOR 1 HOUR. Married. Education: [employer].   Social Drivers of Health   Tobacco Use: Low Risk (12/21/2023)   Patient History    Smoking Tobacco Use: Never    Smokeless Tobacco  Use: Never    Passive Exposure: Not on file  Financial Resource Strain: Low Risk (10/14/2023)   Overall Financial Resource Strain (CARDIA)    Difficulty of Paying Living Expenses: Not very hard  Food Insecurity: No Food Insecurity (10/14/2023)   Epic    Worried About Programme Researcher, Broadcasting/film/video in the Last Year: Never true    Ran Out of Food in the Last Year: Never true  Transportation Needs: No Transportation Needs (10/14/2023)   Epic    Lack of Transportation (Medical): No    Lack of Transportation (Non-Medical): No  Physical Activity: Insufficiently Active (10/14/2023)   Exercise Vital Sign    Days of Exercise per Week: 4 days    Minutes of Exercise per Session: 30 min  Stress: No Stress Concern Present (10/14/2023)   [employer] of Occupational Health - Occupational Stress Questionnaire    Feeling of Stress: Only a little  Recent Concern: Stress - Stress Concern Present (08/29/2023)   [employer] of Occupational Health - Occupational Stress Questionnaire    Feeling of Stress: To some extent  Social Connections: Socially Isolated (10/14/2023)   Social Connection and Isolation Panel    Frequency of Communication with Friends and Family: More than three times a week    Frequency of Social Gatherings with Friends and Family: Three times a week    Attends Religious Services: Patient declined    Active Member of Clubs or Organizations: No    Attends Banker Meetings: Not on file    Marital Status: Divorced  Intimate Partner Violence: Not At Risk (10/15/2023)   Epic    Fear of Current or Ex-Partner: No    Emotionally Abused: No    Physically Abused: No    Sexually Abused: No  Depression (PHQ2-9): Low Risk (12/21/2023)   Depression (PHQ2-9)    PHQ-2 Score: 0  Alcohol Screen: Low Risk (10/14/2023)   Alcohol Screen    Last Alcohol Screening Score (AUDIT): 3  Housing: Unknown (10/14/2023)   Epic    Unable to Pay for Housing in the Last Year: No    Number of  Times Moved in the Last Year: Not on file    Homeless in the Last Year: No  Utilities: Not At Risk (10/15/2023)   Epic    Threatened with loss of utilities: No  Health Literacy: Adequate Health Literacy (10/15/2023)   B1300 Health Literacy    Frequency of need for help with medical instructions: Never      10/14/2023    8:39 PM  Alcohol Use Disorder Test (AUDIT)  1. How often  do you have a drink containing alcohol? 3   2. How many drinks containing alcohol do you have on a typical day when you are drinking? 0   3. How often do you have six or more drinks on one occasion? 0   AUDIT-C Score 3   4. How often during the last year have you found that you were not able to stop drinking once you had started? 0   5. How often during the last year have you failed to do what was normally expected from you because of drinking? 0   6. How often during the last year have you needed a first drink in the morning to get yourself going after a heavy drinking session? 0   7. How often during the last year have you had a feeling of guilt of remorse after drinking? 0   8. How often during the last year have you been unable to remember what happened the night before because you had been drinking? 0   9. Have you or someone else been injured as a result of your drinking? 0   10. Has a relative or friend or a doctor or another health worker been concerned about your drinking or suggested you cut down? 0   Alcohol Use Disorder Identification Test Final Score (AUDIT) 3      Manually entered by patient   Family History  Problem Relation Age of Onset   Heart disease Mother    Heart attack Mother    Hearing loss Father    Hypertension Father    Stroke Brother    Pancreatic cancer Brother    Heart disease Other    Stroke Other    Hyperlipidemia Other    Colon cancer Neg Hx    Colon polyps Neg Hx    Rectal cancer Neg Hx    Esophageal cancer Neg Hx    Stomach cancer Neg Hx   Allergies[2] Social History    Substance and Sexual Activity  Sexual Activity Not Currently   Birth control/protection: None  @    12/21/2023    8:57 AM  Depression screen PHQ 2/9  Decreased Interest 0  Down, Depressed, Hopeless 0  PHQ - 2 Score 0      12/21/2023    8:57 AM  Fall Risk   Falls in the past year? 0  Number falls in past yr: 0  Injury with Fall? 0  Risk for fall due to : No Fall Risks  Follow up Falls evaluation completed     BP 136/74   Pulse 72   Temp 98 F (36.7 C) (Temporal)   Ht 5' 7 (1.702 m)   Wt 194 lb (88 kg)   SpO2 98%   BMI 30.38 kg/m  BP Readings from Last 3 Encounters:  12/21/23 136/74  08/30/23 138/88  11/08/22 130/84   Wt Readings from Last 10 Encounters:  12/21/23 194 lb (88 kg)  10/15/23 181 lb (82.1 kg)  08/30/23 187 lb 6.4 oz (85 kg)  11/08/22 178 lb 3.2 oz (80.8 kg)  01/19/22 188 lb (85.3 kg)  12/07/21 185 lb 6.4 oz (84.1 kg)  11/29/21 188 lb (85.3 kg)  11/08/21 189 lb 9.6 oz (86 kg)  11/03/19 184 lb (83.5 kg)  02/15/17 186 lb (84.4 kg)  Physical Exam  GEN: No acute distress, resting comfortably. HEENT: TEars with scarring bilaterally on tympanic membrane but can still hear tuning fork oropharynx clear, no thyromegaly noted, no palpable lymphadenopathy or thyroid   nodules. CARDIOVASCULAR: S1 and S2 heart sounds with regular rate and rhythm, no murmurs appreciated. PULMONARY: Normal work of breathing, clear to auscultation bilaterally, no crackles, wheezes, or rhonchi. ABDOMEN: Soft, nontender, nondistended. MSK: No edema, cyanosis, or clubbing noted.No breast masses. SKIN: Warm, dry, no lesions of concern observed. NEUROLOGICAL: Cranial nerves II-XII grossly intact, strength 5/5 in upper and lower extremities, reflexes symmetric and intact bilaterally. PSYCH: Normal affect and thought content, pleasant and cooperative.  Last CBC Lab Results  Component Value Date   WBC 5.8 11/08/2022   HGB 14.7 11/08/2022   HCT 44.4 11/08/2022   MCV 87.7  11/08/2022   MCH 28.8 12/18/2014   RDW 14.0 11/08/2022   PLT 212.0 11/08/2022   Last metabolic panel Lab Results  Component Value Date   GLUCOSE 98 11/08/2022   NA 135 11/08/2022   K 4.1 11/08/2022   CL 101 11/08/2022   CO2 25 11/08/2022   BUN 13 11/08/2022   CREATININE 0.99 11/08/2022   GFR 78.99 11/08/2022   CALCIUM  9.4 11/08/2022   PROT 6.8 11/08/2022   ALBUMIN 4.4 11/08/2022   BILITOT 0.7 11/08/2022   ALKPHOS 49 11/08/2022   AST 16 11/08/2022   ALT 16 11/08/2022   Last lipids Lab Results  Component Value Date   CHOL 238 (H) 11/08/2022   HDL 51.20 11/08/2022   LDLCALC 157 (H) 11/08/2022   LDLDIRECT 154.0 12/12/2021   TRIG 149.0 11/08/2022   CHOLHDL 5 11/08/2022   Last hemoglobin A1c Lab Results  Component Value Date   HGBA1C 6.2 11/08/2022   Last thyroid  functions Lab Results  Component Value Date   TSH 1.55 11/08/2022   Last vitamin D  Lab Results  Component Value Date   VD25OH 32.13 12/12/2021   Last vitamin B12 and Folate No results found for: VITAMINB12, FOLATE      ======================================  IMPORTANT HEALTH REMINDERS: Report any new or changing skin lesions promptly Maintain recommended screening schedules Discuss any new family history of cancer at future visits Follow up on any new symptoms that persist more than two weeks      Notes:  This document was synthesized by artificial intelligence (Abridge) using HIPAA-compliant recording of the clinical interaction;   We discussed the use of AI scribe software for clinical note transcription with the patient, who gave verbal consent to proceed.    This encounter employed state-of-the-art, real-time, collaborative documentation. The patient was empowered to actively review and assist in updating their electronic medical record on a shared monitor, ensuring transparency and improving accuracy.    Prior to and at the beginning of Comprehensive Physical Exam (CPE) preventive  care annual visit appointment types  we clarify to patients Our goal today is to focus on your preventive or annual Comprehensive Physical Exam (CPE) preventive care annual visit, which typically covers routine screenings and overall health maintenance. However, if you share any new or concerning symptoms--such as dizziness, passing out, severe pain, or anything else that may point to a more serious issue--we are both legally and ethically required to evaluate it. We cannot simply overlook or ignore such concerns, even if you later decide you dont want to discuss them, because it could jeopardize your health.  If addressing a new concern takes us  beyond the scope of the preventive visit, we may need to bill separately for that portion of care. We understand financial considerations are important, and were happy to discuss your options if something new comes up. However, we want to be clear that once  you mention a potentially serious issue, we must investigate it; we cant ethically or legally exclude that from our records or our evaluation. Please let us  know all of your questions or worries. Together, we can decide how best to manage them and how to minimize any unexpected costs, but we want to keep you safe above all else.   This disclosure is mandated by professional ethics and legal obligations, as healthcare providers must address any substantial health concerns raised during any patient interaction and a comprehensive ROS is required by insurance companies for billing preventive-care visit type.   This disclosure ultimately discourages patients financially from reporting significant health issues.   Medical Screening Exam A medical screening exam (MSE) helps to determine whether you need immediate medical treatment relating to any number of symptoms you are having. This type of exam may be done in an emergency department, an urgent care setting, or your health care provider's office. Depending on your  symptoms and severity, you may need additional tests or medical therapy. It is important to note that an MSE does not necessarily mean that you will need or receive further medical testing or interventions if your symptoms are not deemed to be medically urgent (emergent). Tell a health care provider about: Any allergies you have. All medicines you are taking, including vitamins, herbs, eye drops, creams, and over-the-counter medicines. Any problems you or family members have had with anesthetic medicines. Any bleeding problems you have. Any surgeries you have had. Any medical conditions you have. Whether you are pregnant or may be pregnant. What happens during the test? During the exam, a health care provider does a short, often focused, physical exam and asks about your medical history to assess: Your current symptoms. Your overall health. Your need for possible further medical intervention. What can I expect after the test? If you have a regular health care provider, make an appointment for a follow-up visit with him or her. If you do not have a regular health care provider, ask about resources in your community. Your medical screening exam may determine that: You do not need emergency treatment at this time. You need treatment right away. You need to be transferred to another medical center. This may happen if you need an emergent specialist or consultant that is not available at the medical center you are at. You need to have more tests. A medical specialist may be consulted if needed. Get help right away if: Your condition gets worse. You develop new or troubling symptoms before you see your health care provider. These symptoms may represent a serious problem that is an emergency. Do not wait to see if the symptoms will go away. Get medical help right away. Call your local emergency services (911 in the [place] Do not drive yourself to the hospital. Summary A medical screening exam  helps to determine whether you need medical treatment right away. This type of exam may be done in an emergency department, an urgent care setting, or your health care provider's office. During the exam, a health care provider does a short physical exam and asks about your current symptoms and overall health. Depending on the exam, more tests or therapies may be ordered. However, an MSE does not necessarily mean that you will have further medical testing if your symptoms are not deemed to be urgent. If you need further care that is not offered at your current medical center, you may need to be transferred to another facility. This information is not  intended to replace advice given to you by your health care provider. Make sure you discuss any questions you have with your health care provider. Document Revised: 09/01/2020 Document Reviewed: 04/29/2020 Elsevier Patient Education  2024 [employer]   Health Maintenance, Male Adopting a healthy lifestyle and getting preventive care are important in promoting health and wellness. Ask your health care provider about: The right schedule for you to have regular tests and exams. Things you can do on your own to prevent diseases and keep yourself healthy. What should I know about diet, weight, and exercise? Eat a healthy diet  Eat a diet that includes plenty of vegetables, fruits, low-fat dairy products, and lean protein. Do not eat a lot of foods that are high in solid fats, added sugars, or sodium. Maintain a healthy weight Body mass index (BMI) is a measurement that can be used to identify possible weight problems. It estimates body fat based on height and weight. Your health care provider can help determine your BMI and help you achieve or maintain a healthy weight. Get regular exercise Get regular exercise. This is one of the most important things you can do for your health. Most adults should: Exercise for at least 150 minutes each week. The  exercise should increase your heart rate and make you sweat (moderate-intensity exercise). Do strengthening exercises at least twice a week. This is in addition to the moderate-intensity exercise. Spend less time sitting. Even light physical activity can be beneficial. Watch cholesterol and blood lipids Have your blood tested for lipids and cholesterol at 68 years of age, then have this test every 5 years. You may need to have your cholesterol levels checked more often if: Your lipid or cholesterol levels are high. You are older than 68 years of age. You are at high risk for heart disease. What should I know about cancer screening? Many types of cancers can be detected early and may often be prevented. Depending on your health history and family history, you may need to have cancer screening at various ages. This may include screening for: Colorectal cancer. Prostate cancer. Skin cancer. Lung cancer. What should I know about heart disease, diabetes, and high blood pressure? Blood pressure and heart disease High blood pressure causes heart disease and increases the risk of stroke. This is more likely to develop in people who have high blood pressure readings or are overweight. Talk with your health care provider about your target blood pressure readings. Have your blood pressure checked: Every 3-5 years if you are 68-86 years of age. Every year if you are 11 years old or older. If you are between the ages of 55 and 62 and are a current or former smoker, ask your health care provider if you should have a one-time screening for abdominal aortic aneurysm (AAA). Diabetes Have regular diabetes screenings. This checks your fasting blood sugar level. Have the screening done: Once every three years after age 61 if you are at a normal weight and have a low risk for diabetes. More often and at a younger age if you are overweight or have a high risk for diabetes. What should I know about preventing  infection? Hepatitis B If you have a higher risk for hepatitis B, you should be screened for this virus. Talk with your health care provider to find out if you are at risk for hepatitis B infection. Hepatitis C Blood testing is recommended for: Everyone born from [birth year] through 1965. Anyone with known risk factors for  hepatitis C. Sexually transmitted infections (STIs) You should be screened each year for STIs, including gonorrhea and chlamydia, if: You are sexually active and are younger than 68 years of age. You are older than 68 years of age and your health care provider tells you that you are at risk for this type of infection. Your sexual activity has changed since you were last screened, and you are at increased risk for chlamydia or gonorrhea. Ask your health care provider if you are at risk. Ask your health care provider about whether you are at high risk for HIV. Your health care provider may recommend a prescription medicine to help prevent HIV infection. If you choose to take medicine to prevent HIV, you should first get tested for HIV. You should then be tested every 3 months for as long as you are taking the medicine. Follow these instructions at home: Alcohol use Do not drink alcohol if your health care provider tells you not to drink. If you drink alcohol: Limit how much you have to 0-2 drinks a day. Know how much alcohol is in your drink. In the [place] one drink equals one 12 oz bottle of beer (355 mL), one 5 oz glass of wine (148 mL), or one 1 oz glass of hard liquor (44 mL). Lifestyle Do not use any products that contain nicotine or tobacco. These products include cigarettes, chewing tobacco, and vaping devices, such as e-cigarettes. If you need help quitting, ask your health care provider. Do not use street drugs. Do not share needles. Ask your health care provider for help if you need support or information about quitting drugs. General instructions Schedule regular health,  dental, and eye exams. Stay current with your vaccines. Tell your health care provider if: You often feel depressed. You have ever been abused or do not feel safe at home. Summary Adopting a healthy lifestyle and getting preventive care are important in promoting health and wellness. Follow your health care provider's instructions about healthy diet, exercising, and getting tested or screened for diseases. Follow your health care provider's instructions on monitoring your cholesterol and blood pressure. This information is not intended to replace advice given to you by your health care provider. Make sure you discuss any questions you have with your health care provider. Document Revised: 05/10/2020 Document Reviewed: 05/10/2020 Elsevier Patient Education  2024 [employer]     [1]  Social History Tobacco Use  Smoking Status Never  Smokeless Tobacco Never  [2] No Known Allergies  "

## 2023-12-21 NOTE — Patient Instructions (Addendum)
 It was a pleasure seeing you today! Your health and satisfaction are our top priorities.  Bryant Cone, MD  VISIT SUMMARY: Today, you came in for your annual physical exam. We discussed several health concerns, including your history of elevated PSA levels, pre-diabetes, vitamin D  deficiency, and sleep disturbances. We also reviewed your general health maintenance, including vaccinations and dental care.  YOUR PLAN: -ELEVATED PROSTATE SPECIFIC ANTIGEN (PSA): Your PSA levels are stable, but monitoring is important due to the risk of prostate cancer, especially considering your age and family history. We have ordered a PSA test along with your routine blood work.  -PRE-DIABETES: Pre-diabetes means your blood sugar levels are higher than normal but not high enough to be classified as diabetes. Monitoring your A1c levels is important to track any progression towards diabetes. We have ordered an A1c test.  -VITAMIN D  DEFICIENCY: Vitamin D  deficiency means you have lower than normal levels of vitamin D , which is important for bone health. We have ordered a test to check your vitamin D  levels.  -OVERWEIGHT: Being overweight can increase your risk for various health issues, including heart disease and diabetes. We recommend maintaining a healthy diet and regular exercise.  -CHRONIC PANSINUSITIS: Chronic pansinusitis is a long-term inflammation of the sinuses. Continue with your current treatment plan and let us  know if you experience any new symptoms.  -ALLERGIC RHINITIS AND CONJUNCTIVITIS: These are allergic reactions that affect your nose and eyes. Continue with your current treatment and avoid known allergens.  -GENERAL HEALTH MAINTENANCE: We discussed the importance of vaccinations, dental care, and sleep hygiene. You received a flu vaccine today. Please continue with regular dental check-ups and aim for 5-8 hours of sleep per night. We also discussed the potential benefits of cardiac calcium  score  and lipoprotein A testing for assessing your cardiac risk, which you are considering.  INSTRUCTIONS: Please follow up for your PSA, A1c, and vitamin D  level tests as ordered. Continue with your current treatments for chronic pansinusitis and allergic rhinitis and conjunctivitis. Maintain a healthy diet and regular exercise to manage your weight. Consider the cardiac calcium  score and lipoprotein A testing for further cardiac risk assessment. Ensure you keep up with regular dental check-ups and aim for 5-8 hours of sleep per night.  Your Providers PCP: Bryant Bryant MATSU, MD,  [phone]) Referring Provider: Cone Bryant MATSU, MD,  [phone])  NEXT STEPS: [x]  Early Intervention: Schedule sooner appointment, call our on-call services, or go to emergency room if there is any significant Increase in pain or discomfort New or worsening symptoms Sudden or severe changes in your health [x]  Flexible Follow-Up: We recommend a No follow-ups on file. for optimal routine care. This allows for progress monitoring and treatment adjustments. [x]  Preventive Care: Schedule your annual preventive care visit! It's typically covered by insurance and helps identify potential health issues early. [x]  Lab & X-ray Appointments: Incomplete tests scheduled today, or call to schedule. X-rays: [facility] (M-F, 8:30am-noon or 1pm-5pm). [x]  Medical Information Release: Sign a release form at front desk to obtain relevant medical information we don't have.  MAKING THE MOST OF OUR FOCUSED 20 MINUTE APPOINTMENTS: [x]   Clearly state your top concerns at the beginning of the visit to focus our discussion [x]   If you anticipate you will need more time, please inform the front desk during scheduling - we can book multiple appointments in the same week. [x]   If you have transportation problems- use our convenient video appointments or ask about transportation support. [  x]  We can get down to business faster  if you use MyChart to update information before the visit and submit non-urgent questions before your visit. Thank you for taking the time to provide details through MyChart.  Let our nurse know and she can import this information into your encounter documents.  Arrival and Wait Times: [x]   Arriving on time ensures that everyone receives prompt attention. [x]   Early morning (8a) and afternoon (1p) appointments tend to have shortest wait times. [x]   Unfortunately, we cannot delay appointments for late arrivals or hold slots during phone calls.  Getting Answers and Following Up [x]   Simple Questions & Concerns: For quick questions or basic follow-up after your visit, reach us  at [phone] or MyChart messaging. [x]   Complex Concerns: If your concern is more complex, scheduling an appointment might be best. Discuss this with the staff to find the most suitable option. [x]   Lab & Imaging Results: We'll contact you directly if results are abnormal or you don't use MyChart. Most normal results will be on MyChart within 2-3 business days, with a review message from Dr. Jesus. Haven't heard back in 2 weeks? Need results sooner? Contact us  at [phone]. [x]   Referrals: Our referral coordinator will manage specialist referrals. The specialist's office should contact you within 2 weeks to schedule an appointment. Call us  if you haven't heard from them after 2 weeks.  Staying Connected [x]   MyChart: Activate your MyChart for the fastest way to access results and message us . See the last page of this paperwork for instructions on how to activate.  Bring to Your Next Appointment [x]   Medications: Please bring all your medication bottles to your next appointment to ensure we have an accurate record of your prescriptions. [x]   Health Diaries: If you're monitoring any health conditions at home, keeping a diary of your readings can be very helpful for discussions at your next appointment.  Billing [x]    X-ray & Lab Orders: These are billed by separate companies. Contact the invoicing company directly for questions or concerns. [x]   Visit Charges: Discuss any billing inquiries with our administrative services team.  Your Satisfaction Matters [x]   Share Your Experience: We strive for your satisfaction! If you have any complaints, or preferably compliments, please let Dr. Jesus know directly or contact our Practice Administrators, Manuelita Rubin or [employer], by asking at the front desk.   Reviewing Your Records [x]   Review this early draft of your clinical encounter notes below and the final encounter summary tomorrow on MyChart after its been completed.  All orders placed so far are visible here: Encounter for annual general medical examination with abnormal findings in adult  Elevated prostate specific antigen (PSA) Assessment & Plan: Elevated prostate specific antigen (PSA)   PSA levels are stable despite previous increases. He wakes up to urinate around 5 AM, which he considers normal. Monitoring PSA is crucial due to prostate cancer risk, considering his age and family history. He agreed to repeat PSA testing. Ordered PSA test with routine blood work.  Orders: -     Lipid panel -     Comprehensive metabolic panel with GFR -     CBC with Differential/Platelet -     TSH Rfx on Abnormal to Free T4 -     Lipoprotein A (LPA) -     VITAMIN D  25 Hydroxy (Vit-D Deficiency, Fractures) -     Hemoglobin A1c -     PSA  Healthcare maintenance  Need  for vaccination  Moderate mixed hyperlipidemia not requiring statin therapy  Metabolic syndrome -     Lipid panel  Pre-diabetes -     Hemoglobin A1c  Vitamin D  deficiency  Overweight  Screening for malignant neoplasm of prostate -     PSA  FH: coronary arteriosclerosis -     Lipoprotein A (LPA)  Screening for cardiovascular condition -     Lipoprotein A (LPA)  Medication monitoring encounter -     TSH Rfx on Abnormal to  Free T4  Other orders -     Flu vaccine HIGH DOSE PF(Fluzone Trivalent)            [email or web address] [facility] [address] [place], [place] [address] Phone:[phone]

## 2023-12-21 NOTE — Assessment & Plan Note (Signed)
 He is overweight.check tsh

## 2023-12-21 NOTE — Assessment & Plan Note (Signed)
 He has pre-diabetes. Monitoring A1c levels is important to assess diabetes progression. Ordered A1c test.

## 2023-12-21 NOTE — Assessment & Plan Note (Addendum)
 Elevated prostate specific antigen (PSA)   PSA levels are stable despite previous increases. He wakes up to urinate around 5 AM, which he considers normal. Monitoring PSA is crucial due to prostate cancer risk, considering his age and family history. He agreed to repeat PSA testing. Ordered PSA test with routine blood work.

## 2023-12-21 NOTE — Assessment & Plan Note (Signed)
 He has a known vitamin D  deficiency. Monitoring vitamin D  levels is essential. Ordered vitamin D  level test.

## 2023-12-22 LAB — TSH RFX ON ABNORMAL TO FREE T4: TSH: 1.99 u[IU]/mL (ref 0.450–4.500)

## 2023-12-23 ENCOUNTER — Ambulatory Visit: Payer: Self-pay | Admitting: Internal Medicine

## 2023-12-23 DIAGNOSIS — E7841 Elevated Lipoprotein(a): Secondary | ICD-10-CM

## 2023-12-23 DIAGNOSIS — E782 Mixed hyperlipidemia: Secondary | ICD-10-CM

## 2023-12-26 LAB — LIPOPROTEIN A (LPA): Lipoprotein (a): 121 nmol/L — ABNORMAL HIGH

## 2023-12-27 DIAGNOSIS — E7841 Elevated Lipoprotein(a): Secondary | ICD-10-CM | POA: Insufficient documentation

## 2023-12-27 MED ORDER — ROSUVASTATIN CALCIUM 10 MG PO TABS: 10.0000 mg | ORAL_TABLET | Freq: Every day | ORAL | 3 refills | Status: AC

## 2023-12-28 ENCOUNTER — Other Ambulatory Visit: Payer: Self-pay

## 2023-12-28 DIAGNOSIS — E782 Mixed hyperlipidemia: Secondary | ICD-10-CM

## 2023-12-31 ENCOUNTER — Encounter: Admitting: Internal Medicine

## 2024-10-20 ENCOUNTER — Ambulatory Visit

## 2024-12-23 ENCOUNTER — Encounter: Admitting: Internal Medicine
